# Patient Record
Sex: Female | Born: 1990
Health system: Southern US, Community
[De-identification: ages and names within clinical notes are randomized; demographics above are authoritative.]

## PROBLEM LIST (undated history)

## (undated) DIAGNOSIS — R87629 Unspecified abnormal cytological findings in specimens from vagina: Secondary | ICD-10-CM

## (undated) DIAGNOSIS — Z9889 Other specified postprocedural states: Secondary | ICD-10-CM

## (undated) DIAGNOSIS — R112 Nausea with vomiting, unspecified: Secondary | ICD-10-CM

## (undated) DIAGNOSIS — N946 Dysmenorrhea, unspecified: Secondary | ICD-10-CM

## (undated) HISTORY — PX: WISDOM TOOTH EXTRACTION: SHX21

## (undated) HISTORY — DX: Dysmenorrhea, unspecified: N94.6

## (undated) HISTORY — DX: Unspecified abnormal cytological findings in specimens from vagina: R87.629

---

## 2011-11-14 ENCOUNTER — Encounter: Payer: Self-pay | Admitting: Internal Medicine

## 2011-11-14 ENCOUNTER — Ambulatory Visit (INDEPENDENT_AMBULATORY_CARE_PROVIDER_SITE_OTHER): Payer: BC Managed Care – PPO | Admitting: Internal Medicine

## 2011-11-14 VITALS — BP 110/70 | HR 68 | Temp 97.0°F | Ht 68.0 in | Wt 160.0 lb

## 2011-11-14 DIAGNOSIS — R350 Frequency of micturition: Secondary | ICD-10-CM

## 2011-11-14 DIAGNOSIS — R319 Hematuria, unspecified: Secondary | ICD-10-CM | POA: Insufficient documentation

## 2011-11-14 DIAGNOSIS — Z8271 Family history of polycystic kidney: Secondary | ICD-10-CM

## 2011-11-14 DIAGNOSIS — N946 Dysmenorrhea, unspecified: Secondary | ICD-10-CM | POA: Insufficient documentation

## 2011-11-14 LAB — CBC WITH DIFFERENTIAL/PLATELET
Basophils Absolute: 0.1 10*3/uL (ref 0.0–0.1)
Basophils Relative: 1 % (ref 0–1)
Eosinophils Absolute: 0.2 10*3/uL (ref 0.0–0.7)
HCT: 42.4 % (ref 36.0–46.0)
MCHC: 34.2 g/dL (ref 30.0–36.0)
Monocytes Absolute: 0.5 10*3/uL (ref 0.1–1.0)
Neutro Abs: 2.9 10*3/uL (ref 1.7–7.7)
RDW: 12.2 % (ref 11.5–15.5)

## 2011-11-14 LAB — COMPREHENSIVE METABOLIC PANEL
AST: 19 U/L (ref 0–37)
Albumin: 4.1 g/dL (ref 3.5–5.2)
Alkaline Phosphatase: 37 U/L — ABNORMAL LOW (ref 39–117)
BUN: 11 mg/dL (ref 6–23)
Creat: 0.8 mg/dL (ref 0.50–1.10)
Potassium: 4.1 mEq/L (ref 3.5–5.3)
Total Bilirubin: 0.5 mg/dL (ref 0.3–1.2)

## 2011-11-14 LAB — LIPID PANEL
HDL: 76 mg/dL (ref >39)
LDL Cholesterol: 95 mg/dL (ref 0–99)
Total CHOL/HDL Ratio: 2.7 Ratio
VLDL: 33 mg/dL (ref 0–40)

## 2011-11-14 LAB — POCT URINALYSIS DIPSTICK
Nitrite, UA: NEGATIVE
Urobilinogen, UA: NEGATIVE
pH, UA: 6

## 2011-11-14 LAB — TSH: TSH: 1.154 u[IU]/mL (ref 0.350–4.500)

## 2011-11-14 NOTE — Patient Instructions (Signed)
Labs will be mailed to you  Schedule complete exam with me

## 2011-11-14 NOTE — Progress Notes (Signed)
  Subjective:    Patient ID: Julia Stephens, female    DOB: 01-23-1991, 20 y.o.   MRN: [id number]  HPI  New pt here for first visit.  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  Former care from [employer] in [place].  PMH of dysmenorrhea controlled with Oc's, and GH of polycystic kidney disease  She is concerned about urinary frequency.  She had 2 UTI's over summer.  No dysruria  No Known Allergies Past Medical History  Diagnosis Date  . Dysmenorrhea    History reviewed. No pertinent past surgical history. History   Social History  . Marital Status: Single    Spouse Name: N/A    Number of Children: N/A  . Years of Education: N/A   Occupational History  . Not on file.   Social History Main Topics  . Smoking status: Never Smoker   . Smokeless tobacco: Never Used  . Alcohol Use: No  . Drug Use: No  . Sexually Active: Yes    Birth Control/ Protection: Condom, Pill   Other Topics Concern  . Not on file   Social History Narrative  . No narrative on file   Family History  Problem Relation Age of Onset  . Polycystic kidney disease Maternal Grandmother   . Diabetes Maternal Grandmother   . Hypertension Maternal Grandmother   . Diabetes Maternal Grandfather   . Hypertension Maternal Grandfather    Patient Active Problem List  Diagnoses  . Dysmenorrhea  . Family history of polycystic kidney disease  . Hematuria   No current outpatient prescriptions on file prior to visit.        Review of Systems See HPI    Objective:   Physical Exam Physical Exam  Nursing note and vitals reviewed.  Constitutional: She is oriented to person, place, and time. She appears well-developed and well-nourished.  HENT:  Head: Normocephalic and atraumatic.  Cardiovascular: Normal rate and regular rhythm. Exam reveals no gallop and no friction rub.  No murmur heard.  Pulmonary/Chest: Breath sounds normal. She has no wheezes. She has no rales.  Neurological: She is alert and oriented to  person, place, and time.  Skin: Skin is warm and dry.  Psychiatric: She has a normal mood and affect. Her behavior is normal.        Assessment & Plan:  Dysmenorrhea:  OK to continue Apri 2)  Urinary frequencY:  U/A  Pos trace blood and protein .  Pt not on menses.  Will repeat at CPE visit and if still positive  Will get u/S .  Strong FH of polycystic kidney disease

## 2011-11-15 ENCOUNTER — Encounter: Payer: Self-pay | Admitting: Emergency Medicine

## 2011-12-04 ENCOUNTER — Encounter: Payer: BC Managed Care – PPO | Admitting: Internal Medicine

## 2011-12-07 ENCOUNTER — Ambulatory Visit (INDEPENDENT_AMBULATORY_CARE_PROVIDER_SITE_OTHER): Payer: BC Managed Care – PPO | Admitting: Internal Medicine

## 2011-12-07 ENCOUNTER — Ambulatory Visit (HOSPITAL_BASED_OUTPATIENT_CLINIC_OR_DEPARTMENT_OTHER)
Admission: RE | Admit: 2011-12-07 | Discharge: 2011-12-07 | Disposition: A | Discharge status: Home / Self Care | Payer: BC Managed Care – PPO | Source: Ambulatory Visit | Attending: Internal Medicine | Admitting: Internal Medicine

## 2011-12-07 ENCOUNTER — Encounter: Payer: Self-pay | Admitting: Internal Medicine

## 2011-12-07 VITALS — BP 110/70 | HR 77 | Temp 97.3°F | Resp 12 | Ht 68.0 in | Wt 164.0 lb

## 2011-12-07 DIAGNOSIS — Z8271 Family history of polycystic kidney: Secondary | ICD-10-CM

## 2011-12-07 DIAGNOSIS — R319 Hematuria, unspecified: Secondary | ICD-10-CM

## 2011-12-07 DIAGNOSIS — Z01419 Encounter for gynecological examination (general) (routine) without abnormal findings: Secondary | ICD-10-CM

## 2011-12-07 DIAGNOSIS — N946 Dysmenorrhea, unspecified: Secondary | ICD-10-CM

## 2011-12-07 DIAGNOSIS — Z113 Encounter for screening for infections with a predominantly sexual mode of transmission: Secondary | ICD-10-CM

## 2011-12-07 DIAGNOSIS — Z1272 Encounter for screening for malignant neoplasm of vagina: Secondary | ICD-10-CM

## 2011-12-07 LAB — POCT URINALYSIS DIPSTICK
Bilirubin, UA: NEGATIVE
Glucose, UA: NEGATIVE
Ketones, UA: NEGATIVE
Leukocytes, UA: NEGATIVE
pH, UA: 6

## 2011-12-07 MED ORDER — DESOGESTREL-ETHINYL ESTRADIOL 0.15-30 MG-MCG PO TABS: 1.0000 | ORAL_TABLET | Freq: Every day | ORAL | Status: DC

## 2011-12-07 NOTE — Progress Notes (Signed)
Subjective:    Patient ID: Julia Stephens, female    DOB: Jan 07, 1991, 21 y.o.   MRN: [id number]  HPI [name] is here for comprehensive eval.  Doing well on OC's  Less cramping.  She is concerned over PCKD.  Note last U/A revealed  Hematuria with no growth on C and S.  She denies change in color of urine or dysuria, urgency of frequency.    Report she had a normal pap smear 1-2 years ago in [place].  She is sexually active with one partner.    She is studying PT at [employer] list last quarter.  She believes she is UTD with Tetanus  No Known Allergies Past Medical History  Diagnosis Date  . Dysmenorrhea    No past surgical history on file. History   Social History  . Marital Status: Single    Spouse Name: N/A    Number of Children: N/A  . Years of Education: N/A   Occupational History  . Not on file.   Social History Main Topics  . Smoking status: Never Smoker   . Smokeless tobacco: Never Used  . Alcohol Use: No  . Drug Use: No  . Sexually Active: Yes    Birth Control/ Protection: Condom, Pill   Other Topics Concern  . Not on file   Social History Narrative  . No narrative on file   Family History  Problem Relation Age of Onset  . Polycystic kidney disease Maternal Grandmother   . Diabetes Maternal Grandmother   . Hypertension Maternal Grandmother   . Diabetes Maternal Grandfather   . Hypertension Maternal Grandfather    Patient Active Problem List  Diagnoses  . Dysmenorrhea  . Family history of polycystic kidney disease  . Hematuria   Current Outpatient Prescriptions on File Prior to Visit  Medication Sig Dispense Refill  . desogestrel-ethinyl estradiol (APRI,EMOQUETTE,SOLIA) 0.15-30 MG-MCG tablet Take 1 tablet by mouth daily.             Review of Systems    no chest pain , no SOB No dyspepsia, no blood in stool Objective:   Physical Exam  Physical Exam  Vital signs and nursing note reviewed  Constitutional: She is oriented to person, place,  and time. She appears well-developed and well-nourished. She is cooperative.  HENT:  Head: Normocephalic and atraumatic.  Right Ear: Tympanic membrane normal.  Left Ear: Tympanic membrane normal.  Nose: Nose normal.  Mouth/Throat: Oropharynx is clear and moist and mucous membranes are normal. No oropharyngeal exudate or posterior oropharyngeal erythema.  Eyes: Conjunctivae and EOM are normal. Pupils are equal, round, and reactive to light.  Neck: Neck supple. No JVD present. Carotid bruit is not present. No mass and no thyromegaly present.  Cardiovascular: Regular rhythm, normal heart sounds, intact distal pulses and normal pulses.  Exam reveals no gallop and no friction rub.   No murmur heard. Pulses:      Dorsalis pedis pulses are 2+ on the right side, and 2+ on the left side.  Pulmonary/Chest: Breath sounds normal. She has no wheezes. She has no rhonchi. She has no rales. Right breast exhibits no mass, no nipple discharge and no skin change. Left breast exhibits no mass, no nipple discharge and no skin change.  Abdominal: Soft. Bowel sounds are normal. She exhibits no distension and no mass. There is no hepatosplenomegaly. There is no tenderness. There is no CVA tenderness.  Genitourinary: Rectum normal, vagina normal and uterus normal. Rectal exam shows no mass.Marland Kitchen  No labial fusion. There is no lesion on the right labia. There is no lesion on the left labia. Cervix exhibits no motion tenderness. Right adnexum displays no mass, no tenderness and no fullness. Left adnexum displays no mass, no tenderness and no fullness. No erythema around the vagina.  Musculoskeletal:       No active synovitis to any joint.    Lymphadenopathy:       Right cervical: No superficial cervical adenopathy present.      Left cervical: No superficial cervical adenopathy present.       Right axillary: No pectoral and no lateral adenopathy present.       Left axillary: No pectoral and no lateral adenopathy present.       Right: No inguinal adenopathy present.       Left: No inguinal adenopathy present.  Neurological: She is alert and oriented to person, place, and time. She has normal strength and normal reflexes. No cranial nerve deficit or sensory deficit. She displays a negative Romberg sign. Coordination and gait normal.  Skin: Skin is warm and dry. No abrasion, no bruising, no ecchymosis and no rash noted. No cyanosis. Nails show no clubbing.  Psychiatric: She has a normal mood and affect. Her speech is normal and behavior is normal.          Assessment & Plan:  1)  HM  Taught SBE.  Counseled safe sex practices.  She declines influenza vaccine.  She reports received 3 series of HPV vaccine.  Counseled on adequate doses of CA and vit D 2)  Hematuria  Still trace pos today.  With FH of PCKD will get renal ultrasound today 3)  Dysmenorrhea  Will continue Apri      Assessment & Plan:

## 2011-12-07 NOTE — Patient Instructions (Signed)
Use condoms for safer sex  Exercise as much as possible  Follow heart healty diet

## 2011-12-14 ENCOUNTER — Telehealth: Payer: Self-pay | Admitting: Internal Medicine

## 2011-12-14 NOTE — Telephone Encounter (Signed)
Left message on voicemail  Pt unavailabe and told to call office on Monday

## 2011-12-14 NOTE — Telephone Encounter (Signed)
Left message on pts voice mail to call back regarding U/S and pap result.

## 2011-12-18 ENCOUNTER — Telehealth: Payer: Self-pay | Admitting: Internal Medicine

## 2011-12-18 ENCOUNTER — Encounter: Payer: Self-pay | Admitting: Internal Medicine

## 2011-12-18 DIAGNOSIS — IMO0002 Reserved for concepts with insufficient information to code with codable children: Secondary | ICD-10-CM | POA: Insufficient documentation

## 2011-12-18 DIAGNOSIS — R87612 Low grade squamous intraepithelial lesion on cytologic smear of cervix (LGSIL): Secondary | ICD-10-CM

## 2011-12-18 NOTE — Telephone Encounter (Signed)
Spoke with pt and informed of LGSIL pap and HPV positive on result.  Stressed importance of having GYN eval.  Will set up referral.  She voices understanding

## 2011-12-19 ENCOUNTER — Encounter: Payer: Self-pay | Admitting: Internal Medicine

## 2011-12-27 ENCOUNTER — Telehealth: Payer: Self-pay | Admitting: Emergency Medicine

## 2011-12-27 NOTE — Telephone Encounter (Signed)
Julia Stephens's lab letter dated 11/15/11 was returned marked "Return to Sender, unable to forward, undeliverable as addressed" by the [employer].  Letter was mailed to home address listed in Epic.

## 2012-01-09 ENCOUNTER — Other Ambulatory Visit: Payer: Self-pay | Admitting: Obstetrics and Gynecology

## 2012-01-09 HISTORY — PX: COLPOSCOPY: SHX161

## 2012-09-18 ENCOUNTER — Emergency Department (HOSPITAL_BASED_OUTPATIENT_CLINIC_OR_DEPARTMENT_OTHER)
Admission: EM | Admit: 2012-09-18 | Discharge: 2012-09-18 | Disposition: A | Discharge status: Home / Self Care | Payer: Worker's Compensation | Attending: Emergency Medicine | Admitting: Emergency Medicine

## 2012-09-18 ENCOUNTER — Emergency Department (HOSPITAL_BASED_OUTPATIENT_CLINIC_OR_DEPARTMENT_OTHER): Payer: Worker's Compensation

## 2012-09-18 ENCOUNTER — Encounter (HOSPITAL_BASED_OUTPATIENT_CLINIC_OR_DEPARTMENT_OTHER): Payer: Self-pay | Admitting: *Deleted

## 2012-09-18 DIAGNOSIS — Y990 Civilian activity done for income or pay: Secondary | ICD-10-CM | POA: Insufficient documentation

## 2012-09-18 DIAGNOSIS — S8010XA Contusion of unspecified lower leg, initial encounter: Secondary | ICD-10-CM | POA: Insufficient documentation

## 2012-09-18 DIAGNOSIS — IMO0002 Reserved for concepts with insufficient information to code with codable children: Secondary | ICD-10-CM | POA: Insufficient documentation

## 2012-09-18 DIAGNOSIS — Y9289 Other specified places as the place of occurrence of the external cause: Secondary | ICD-10-CM | POA: Insufficient documentation

## 2012-09-18 NOTE — ED Provider Notes (Signed)
History     CSN: [id number]  Arrival date & time 09/18/12  4098   First MD Initiated Contact with Patient 09/18/12 2030      Chief Complaint  Patient presents with  . Leg Pain    (Consider location/radiation/quality/duration/timing/severity/associated sxs/prior treatment) Patient is a 21 y.o. female presenting with leg pain. The history is provided by the patient.  Leg Pain  The incident occurred 1 to 2 hours ago. The incident occurred at work. The injury mechanism was a direct blow. Pain location: left lower shin. The quality of the pain is described as throbbing. The pain is moderate. The pain has been constant since onset. Pertinent negatives include no numbness and no inability to bear weight. The symptoms are aggravated by activity, bearing weight and palpation.    Past Medical History  Diagnosis Date  . Dysmenorrhea     History reviewed. No pertinent past surgical history.  Family History  Problem Relation Age of Onset  . Polycystic kidney disease Maternal Grandmother   . Diabetes Maternal Grandmother   . Hypertension Maternal Grandmother   . Diabetes Maternal Grandfather   . Hypertension Maternal Grandfather     History  Substance Use Topics  . Smoking status: Never Smoker   . Smokeless tobacco: Never Used  . Alcohol Use: No    OB History    Grav Para Term Preterm Abortions TAB SAB Ect Mult Living   0 0              Review of Systems  Neurological: Negative for numbness.  All other systems reviewed and are negative.    Allergies  Review of patient's allergies indicates no known allergies.  Home Medications   Current Outpatient Rx  Name Route Sig Dispense Refill  . DESOGESTREL-ETHINYL ESTRADIOL 0.15-30 MG-MCG PO TABS Oral Take 1 tablet by mouth daily. 1 Package 11    BP 137/79  Pulse 77  Temp 98.2 F (36.8 C) (Oral)  Resp 16  Ht 5\' 8"  (1.727 m)  Wt 155 lb (70.308 kg)  BMI 23.57 kg/m2  SpO2 100%  LMP 08/22/2012  Physical Exam  Nursing  note and vitals reviewed. Constitutional: She is oriented to person, place, and time. She appears well-developed and well-nourished. No distress.  HENT:  Head: Normocephalic and atraumatic.  Neck: Normal range of motion. Neck supple.  Musculoskeletal:       The left lower extremity is noted to have an abrasion with surrounding swelling and ttp.  The distal extremity is intact both neurologically and vascularly.  Neurological: She is alert and oriented to person, place, and time.  Skin: Skin is warm and dry. She is not diaphoretic.    ED Course  Procedures (including critical care time)  Labs Reviewed - No data to display Dg Tibia/fibula Left  09/18/2012  *RADIOLOGY REPORT*  Clinical Data: Trauma.  Pain.  LEFT TIBIA AND FIBULA - 2 VIEW  Comparison: None.  Findings: A nutrient foramen within the fibular shaft on the first AP view. No acute fracture or dislocation.  No knee joint effusion.  IMPRESSION: No acute osseous abnormality.   Original Report Authenticated By: Consuello Bossier, M.D.      No diagnosis found.    MDM  Will treat as a contusion.  Ice, rest, time.  Return prn if she worsens.        Geoffery Lyons, MD 09/18/12 2046

## 2012-09-18 NOTE — ED Notes (Signed)
Pt c/o injury to left lower  leg x 2 hrs ago

## 2012-11-04 ENCOUNTER — Other Ambulatory Visit: Payer: Self-pay | Admitting: *Deleted

## 2012-11-04 DIAGNOSIS — N946 Dysmenorrhea, unspecified: Secondary | ICD-10-CM

## 2012-11-04 MED ORDER — DESOGESTREL-ETHINYL ESTRADIOL 0.15-30 MG-MCG PO TABS: 1.0000 | ORAL_TABLET | Freq: Every day | ORAL | Status: DC

## 2012-11-04 NOTE — Telephone Encounter (Signed)
Pt has scheduled her CPE with pap in Feb needs refill until then

## 2012-11-10 ENCOUNTER — Other Ambulatory Visit: Payer: Self-pay | Admitting: Internal Medicine

## 2012-11-11 NOTE — Telephone Encounter (Signed)
Julia Stephens  I refilled 3 month of OC"S for this pt.  I need to see my patients once a year to refill Oc's.  Call pt and let her know that she needs to see me sometime in the next 3 months before she runs out again

## 2012-11-11 NOTE — Telephone Encounter (Signed)
Refill request

## 2012-11-11 NOTE — Telephone Encounter (Signed)
Called pt regarding appt 

## 2012-11-12 ENCOUNTER — Other Ambulatory Visit: Payer: Self-pay | Admitting: Obstetrics and Gynecology

## 2013-01-08 ENCOUNTER — Other Ambulatory Visit: Payer: Self-pay | Admitting: Internal Medicine

## 2013-01-08 ENCOUNTER — Encounter: Payer: Self-pay | Admitting: Internal Medicine

## 2013-01-08 ENCOUNTER — Ambulatory Visit (INDEPENDENT_AMBULATORY_CARE_PROVIDER_SITE_OTHER): Payer: BC Managed Care – PPO | Admitting: Internal Medicine

## 2013-01-08 VITALS — BP 120/70 | HR 87 | Temp 97.1°F | Resp 16 | Ht 68.0 in | Wt 163.0 lb

## 2013-01-08 DIAGNOSIS — R6889 Other general symptoms and signs: Secondary | ICD-10-CM

## 2013-01-08 DIAGNOSIS — Z0000 Encounter for general adult medical examination without abnormal findings: Secondary | ICD-10-CM

## 2013-01-08 DIAGNOSIS — Z8271 Family history of polycystic kidney: Secondary | ICD-10-CM

## 2013-01-08 DIAGNOSIS — Z8619 Personal history of other infectious and parasitic diseases: Secondary | ICD-10-CM

## 2013-01-08 DIAGNOSIS — IMO0002 Reserved for concepts with insufficient information to code with codable children: Secondary | ICD-10-CM

## 2013-01-08 LAB — CBC WITH DIFFERENTIAL/PLATELET
Basophils Relative: 1 % (ref 0–1)
Eosinophils Absolute: 0.2 10*3/uL (ref 0.0–0.7)
HCT: 42.6 % (ref 36.0–46.0)
Hemoglobin: 15.2 g/dL — ABNORMAL HIGH (ref 12.0–15.0)
Lymphs Abs: 2.5 10*3/uL (ref 0.7–4.0)
MCH: 31.3 pg (ref 26.0–34.0)
MCHC: 35.7 g/dL (ref 30.0–36.0)
MCV: 87.7 fL (ref 78.0–100.0)
Monocytes Absolute: 0.7 10*3/uL (ref 0.1–1.0)
Monocytes Relative: 10 % (ref 3–12)
Neutrophils Relative %: 50 % (ref 43–77)
RBC: 4.86 MIL/uL (ref 3.87–5.11)

## 2013-01-08 LAB — POCT URINALYSIS DIPSTICK
Ketones, UA: NEGATIVE
Spec Grav, UA: 1.015
Urobilinogen, UA: 0.2
pH, UA: 6.5

## 2013-01-08 NOTE — Patient Instructions (Addendum)
Follow up with Dr. Eustaquio Boyden  For pap   See me as needed

## 2013-01-08 NOTE — Progress Notes (Signed)
Subjective:    Patient ID: Julia Stephens, female    DOB: Jul 15, 1991, 22 y.o.   MRN: [id number]  HPI  Shilee is here for CPE.  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  Studies going well.    Tolerating OC's well  In monogamous relationship with live in boyfriend  See scanned note Dr. Priscille Kluver office  She had cryotherapy for LGSIL and HPV pos.  Pt reports she had pap last December at Dr. Priscille Kluver office and she reports it came back normal  Pt also reports she had chickenpox as a child and also has had shingles in the past.  First shingles episode was diagnosed by MD in [facility]  Allergies  Allergen Reactions  . Other Itching    Allergic to cats   Past Medical History  Diagnosis Date  . Dysmenorrhea    Past Surgical History  Procedure Date  . Colposcopy 01/09/2012    Colpo LGSILw/HPV   History   Social History  . Marital Status: Single    Spouse Name: N/A    Number of Children: N/A  . Years of Education: N/A   Occupational History  . Not on file.   Social History Main Topics  . Smoking status: Never Smoker   . Smokeless tobacco: Never Used  . Alcohol Use: Yes  . Drug Use: No  . Sexually Active: Yes    Birth Control/ Protection: Condom, Pill   Other Topics Concern  . Not on file   Social History Narrative  . No narrative on file   Family History  Problem Relation Age of Onset  . Polycystic kidney disease Maternal Grandmother   . Diabetes Maternal Grandmother   . Hypertension Maternal Grandmother   . Vision loss Maternal Grandmother   . Diabetes Maternal Grandfather   . Hypertension Maternal Grandfather   . Vision loss Maternal Grandfather   . Hyperlipidemia Father   . Hypertension Father   . Hyperlipidemia Brother   . Hypertension Brother   . Diabetes Paternal Grandfather    Patient Active Problem List  Diagnosis  . Dysmenorrhea  . Family history of polycystic kidney disease  . Hematuria  . Low grade squamous intraepithelial dysplasia   Current Outpatient  Prescriptions on File Prior to Visit  Medication Sig Dispense Refill  . desogestrel-ethinyl estradiol (APRI,EMOQUETTE,SOLIA) 0.15-30 MG-MCG tablet Take 1 tablet by mouth daily.  1 Package  11      Review of Systems  All other systems reviewed and are negative.       Objective:   Physical Exam Physical Exam  Nursing note and vitals reviewed.  Constitutional: She is oriented to person, place, and time. She appears well-developed and well-nourished.  HENT:  Head: Normocephalic and atraumatic.  Right Ear: Tympanic membrane and ear canal normal. No drainage. Tympanic membrane is not injected and not erythematous.  Left Ear: Tympanic membrane and ear canal normal. No drainage. Tympanic membrane is not injected and not erythematous.  Nose: Nose normal. Right sinus exhibits no maxillary sinus tenderness and no frontal sinus tenderness. Left sinus exhibits no maxillary sinus tenderness and no frontal sinus tenderness.  Mouth/Throat: Oropharynx is clear and moist. No oral lesions. No oropharyngeal exudate.  Eyes: Conjunctivae and EOM are normal. Pupils are equal, round, and reactive to light.  Neck: Normal range of motion. Neck supple. No JVD present. Carotid bruit is not present. No mass and no thyromegaly present.  Cardiovascular: Normal rate, regular rhythm, S1 normal, S2 normal and intact distal pulses. Exam reveals no gallop  and no friction rub.  No murmur heard.  Pulses:  Carotid pulses are 2+ on the right side, and 2+ on the left side.  Dorsalis pedis pulses are 2+ on the right side, and 2+ on the left side.  No carotid bruit. No LE edema  Pulmonary/Chest: Breath sounds normal. She has no wheezes. She has no rales. She exhibits no tenderness.  Abdominal: Soft. Bowel sounds are normal. She exhibits no distension and no mass. There is no hepatosplenomegaly. There is no tenderness. There is no CVA tenderness.  Musculoskeletal: Normal range of motion.  No active synovitis to joints.   Lymphadenopathy:  She has no cervical adenopathy.  She has no axillary adenopathy.  Right: No inguinal and no supraclavicular adenopathy present.  Left: No inguinal and no supraclavicular adenopathy present.  Neurological: She is alert and oriented to person, place, and time. She has normal strength and normal reflexes. She displays no tremor. No cranial nerve deficit or sensory deficit. Coordination and gait normal.  Skin: Skin is warm and dry. No rash noted. No cyanosis. Nails show no clubbing.  Psychiatric: She has a normal mood and affect. Her speech is normal and behavior is normal. Cognition and memory are normal.           Assessment & Plan:  Health Maintenance  UTD with vaccines.  Gave infor for shingles.  History of LCSIL S/P cryotherapy.  Pt advised to follow up with Dr. Eustaquio Boyden  History of shingles  See above   FH of PCKD

## 2013-01-09 ENCOUNTER — Telehealth: Payer: Self-pay | Admitting: *Deleted

## 2013-01-09 LAB — LIPID PANEL
Cholesterol: 194 mg/dL (ref 0–200)
LDL Cholesterol: 85 mg/dL (ref 0–99)
Total CHOL/HDL Ratio: 2.7 Ratio
VLDL: 36 mg/dL (ref 0–40)

## 2013-01-09 LAB — T3, FREE: T3, Free: 3.2 pg/mL (ref 2.3–4.2)

## 2013-01-09 LAB — TSH: TSH: 0.345 u[IU]/mL — ABNORMAL LOW (ref 0.350–4.500)

## 2013-01-09 NOTE — Telephone Encounter (Signed)
Free T3 and Free T4 added to original labs

## 2013-01-13 ENCOUNTER — Telehealth: Payer: Self-pay | Admitting: Internal Medicine

## 2013-01-13 NOTE — Telephone Encounter (Signed)
Called pt regarding labs  Pt not available.  Counseled to call office back

## 2013-01-14 ENCOUNTER — Telehealth: Payer: Self-pay | Admitting: Internal Medicine

## 2013-01-14 ENCOUNTER — Encounter: Payer: Self-pay | Admitting: *Deleted

## 2013-01-14 NOTE — Telephone Encounter (Signed)
Spoke with pt and informed of TSH results.  She has normal free levels  ADvised pt to come for repeat TSH.  She voices understanding

## 2013-01-20 ENCOUNTER — Telehealth: Payer: Self-pay | Admitting: *Deleted

## 2013-01-20 DIAGNOSIS — R7989 Other specified abnormal findings of blood chemistry: Secondary | ICD-10-CM

## 2013-01-22 NOTE — Telephone Encounter (Signed)
orders

## 2013-01-24 LAB — TSH: TSH: 0.192 u[IU]/mL — ABNORMAL LOW (ref 0.350–4.500)

## 2013-01-28 ENCOUNTER — Telehealth: Payer: Self-pay | Admitting: Internal Medicine

## 2013-01-28 DIAGNOSIS — E0590 Thyrotoxicosis, unspecified without thyrotoxic crisis or storm: Secondary | ICD-10-CM

## 2013-01-28 NOTE — Telephone Encounter (Signed)
Spoke with pt.  Repeat TSH still supressed.  Will get nuclear scan and refer to Dr. Elvera Lennox

## 2013-01-29 ENCOUNTER — Other Ambulatory Visit: Payer: Self-pay | Admitting: Internal Medicine

## 2013-01-29 ENCOUNTER — Telehealth: Payer: Self-pay | Admitting: *Deleted

## 2013-01-29 NOTE — Telephone Encounter (Signed)
Called [facility] radiology will need to verify orders

## 2013-01-30 ENCOUNTER — Other Ambulatory Visit: Payer: Self-pay | Admitting: Internal Medicine

## 2013-01-30 ENCOUNTER — Telehealth: Payer: Self-pay | Admitting: *Deleted

## 2013-01-30 DIAGNOSIS — E0590 Thyrotoxicosis, unspecified without thyrotoxic crisis or storm: Secondary | ICD-10-CM

## 2013-01-30 NOTE — Telephone Encounter (Signed)
Spoke with [facility]iology regarding Thyroid scan appt scheduled for 3/10-3/11at 08:00. Left message for pt regarding appt info

## 2013-02-05 ENCOUNTER — Telehealth: Payer: Self-pay | Admitting: Internal Medicine

## 2013-02-05 ENCOUNTER — Encounter: Payer: Self-pay | Admitting: *Deleted

## 2013-02-05 NOTE — Telephone Encounter (Signed)
Left message for pt to call office regarding upcoming test.  Apparently a problem with payment for test and pt does not want to take it

## 2013-02-10 ENCOUNTER — Encounter (HOSPITAL_COMMUNITY)
Admission: RE | Admit: 2013-02-10 | Discharge: 2013-02-10 | Disposition: A | Discharge status: Home / Self Care | Payer: BC Managed Care – PPO | Source: Ambulatory Visit | Attending: Internal Medicine | Admitting: Internal Medicine

## 2013-02-10 ENCOUNTER — Ambulatory Visit (HOSPITAL_COMMUNITY): Payer: Self-pay

## 2013-02-10 DIAGNOSIS — E0590 Thyrotoxicosis, unspecified without thyrotoxic crisis or storm: Secondary | ICD-10-CM | POA: Insufficient documentation

## 2013-02-11 ENCOUNTER — Telehealth: Payer: Self-pay | Admitting: *Deleted

## 2013-02-11 ENCOUNTER — Other Ambulatory Visit (HOSPITAL_COMMUNITY): Payer: Self-pay

## 2013-02-11 ENCOUNTER — Telehealth: Payer: Self-pay | Admitting: Internal Medicine

## 2013-02-11 ENCOUNTER — Encounter (HOSPITAL_COMMUNITY)
Admission: RE | Admit: 2013-02-11 | Discharge: 2013-02-11 | Disposition: A | Discharge status: Home / Self Care | Payer: BC Managed Care – PPO | Source: Ambulatory Visit | Attending: Internal Medicine | Admitting: Internal Medicine

## 2013-02-11 DIAGNOSIS — E061 Subacute thyroiditis: Secondary | ICD-10-CM

## 2013-02-11 MED ORDER — SODIUM IODIDE I 131 CAPSULE
11.0000 | Freq: Once | INTRAVENOUS | Status: AC | PRN
  Administered 2013-02-11: 11 via ORAL

## 2013-02-11 MED ORDER — SODIUM PERTECHNETATE TC 99M INJECTION
9.3000 | Freq: Once | INTRAVENOUS | Status: AC | PRN
  Administered 2013-02-11: 9 via INTRAVENOUS

## 2013-02-11 NOTE — Telephone Encounter (Signed)
Spoke with pt and informed of nuclear scan  Will refer to endocrinology

## 2013-02-12 ENCOUNTER — Telehealth: Payer: Self-pay | Admitting: *Deleted

## 2013-02-12 NOTE — Telephone Encounter (Signed)
Pt notified of appt with Dr Elvera Lennox on 3/20 at 4:00

## 2013-02-20 ENCOUNTER — Encounter: Payer: Self-pay | Admitting: Internal Medicine

## 2013-02-20 ENCOUNTER — Ambulatory Visit (INDEPENDENT_AMBULATORY_CARE_PROVIDER_SITE_OTHER): Payer: BC Managed Care – PPO | Admitting: Internal Medicine

## 2013-02-20 VITALS — BP 112/64 | HR 69 | Temp 98.3°F | Resp 10 | Ht 67.5 in | Wt 163.0 lb

## 2013-02-20 DIAGNOSIS — R946 Abnormal results of thyroid function studies: Secondary | ICD-10-CM

## 2013-02-20 DIAGNOSIS — R7989 Other specified abnormal findings of blood chemistry: Secondary | ICD-10-CM | POA: Insufficient documentation

## 2013-02-20 NOTE — Patient Instructions (Addendum)
Please return to have a new set of thyroid labs in 2 months. If they are normal, then he can continue to see Dr. Constance Goltz, and return to see me if they become abnormal again. Please review the handout about thyroiditis.

## 2013-02-20 NOTE — Progress Notes (Signed)
Subjective:     Patient ID: Julia Stephens, female   DOB: 10-14-1991, 21 y.o.   MRN: [id number]  HPI Julia Stephens is a pleasant 39 year old woman, referred by her PCP, Dr. Constance Goltz, for evaluation and treatment for low TSH, ? thyroiditis.  Patient has not been diagnosed with any thyroid disorders in the past, and had a normal TSH in 11/2011, at 1.154. She subsequently had an incidental low TSH on the labs ordered for an annual physical exam on 01/08/2013 (TSH 0.345). Repeat TSH on 01/24/2013 was 0.192, with normal free T4 at 1.40, and free T3 is at 3.2. A thyroid uptake and scan was ordered, and it returned low at 2.8% (normal 10-30%), suggesting subacute thyroiditis.   Patient remembers that she had a cold 2 months ago; no fever, but a little sore throat. Did not feel that her thyroid is enlarged. Could not appreciate that her lymph nodes are enlarged. She had shingles 3x (near the neck) - las episode 2 years ago - and cervical LNs became enlarged then. Denies tremors, palpitations, feeling hot, weight loss, problems with sleep or concentration, diarrhea. She also denies increased fatigue, feeling cold, depression or anxiety, constipation, weight gain, hair loss, dry skin. She exercises at least 3 times a week at the gym doing cardio and strength training.  No FH of thyroid disorder. No thyroid cancer hx. No FH of autoimmune ds.   Past Medical History  Diagnosis Date  . Dysmenorrhea     Past Surgical History  Procedure Laterality Date  . Colposcopy  01/09/2012    Colpo LGSILw/HPV   History   Social History  . Marital Status: Single    Spouse Name: N/A    Number of Children: 0   Occupational History  . student   Social History Main Topics  . Smoking status: Never Smoker   . Smokeless tobacco: Never Used  . Alcohol Use: Yes  . Drug Use: No  . Sexually Active: Yes    Birth Control/ Protection: Condom, Pill   Current Outpatient Prescriptions on File Prior to Visit  Medication Sig  Dispense Refill  . desogestrel-ethinyl estradiol (APRI,EMOQUETTE,SOLIA) 0.15-30 MG-MCG tablet Take 1 tablet by mouth daily.  1 Package  11   No current facility-administered medications on file prior to visit.   Allergies  Allergen Reactions  . Other Itching    Allergic to cats   Family History  Problem Relation Age of Onset  . Polycystic kidney disease Maternal Grandmother   . Diabetes Maternal Grandmother   . Hypertension Maternal Grandmother   . Vision loss Maternal Grandmother   . Diabetes Maternal Grandfather   . Hypertension Maternal Grandfather   . Vision loss Maternal Grandfather   . Hyperlipidemia Father   . Hypertension Father   . Hyperlipidemia Brother   . Hypertension Brother   . Diabetes Paternal Grandfather    Review of Systems Constitutional: no weight gain/loss, no fatigue, no subjective hyperthermia/hypothermia Eyes: no blurry vision, no xerophthalmia ENT: no sore throat, no nodules palpated in throat, no dysphagia/odynophagia, no hoarseness Cardiovascular: no CP/SOB/palpitations/leg swelling Respiratory: no cough/SOB Gastrointestinal: no N/V/D/C Musculoskeletal: no muscle/joint aches Skin: no rashes Neurological: no tremors/numbness/tingling/dizziness Psychiatric: no depression/anxiety  Objective:   Physical Exam BP 112/64  Pulse 69  Temp(Src) 98.3 F (36.8 C) (Oral)  Resp 10  Ht 5' 7.5" (1.715 m)  Wt 163 lb (73.936 kg)  BMI 25.14 kg/m2  SpO2 97%  LMP 02/03/2013 Wt Readings from Last 3 Encounters:  02/20/13 163 lb (73.936 kg)  01/08/13 163 lb (73.936 kg)  09/18/12 155 lb (70.308 kg)   Constitutional: overweight, in NAD Eyes: PERRLA, EOMI, no exophthalmos, no lid lag, no stare ENT: moist mucous membranes, no thyromegaly, no cervical lymphadenopathy Cardiovascular: RRR, No MRG Respiratory: CTA B Gastrointestinal: abdomen soft, NT, ND, BS+ Musculoskeletal: no deformities, strength intact in all 4 Skin: moist, warm, no rashes Neurological:  slight tremor with outstretched hands, DTR normal in all 4  Assessment:     1. Mild subacute thyroiditis - TSH 0.192, freeT4 1 0.40, freeT3 3.2 in 01/24/2013  - TSH 0.345 in 01/08/2013 - TSH 1.154 on 11/14/2011 - thyroid uptake and scan showed reduced uptake of 2.8% (10-30%), and no nodules - patient completely asymptomatic  Plan:     Patient with no previous history of thyroid disorder, incidentally found to have a low TSH on annual physical exam with PCP. An initial TSH was mildly low, and a subsequent one, obtained approximately a month ago was even lower, however free T4 and free T3 were normal (subclinical hyperthyroidism). Of note, the patient does not have any symptoms or signs of hyper- or hypo-thyroidism. - I discussed with the patient possible etiologies of low TSH: prescribed medications, herbal medicines, IV contrast, pregnancy, etc. however she did not have any of these, and denies the possibility of being pregnant. Thyroid pathology responsible of a low TSH would be: Graves' disease (however this would have increased thyroid iodine uptake), toxic uni- or multinodular goiter (however this would have an abnormal thyroid scan), or thyroiditis (associated with a low thyroid uptake). It is most likely that she had an episode of mild subacute thyroiditis (at the time of her URI in 2 months ago) which is probably resolving, judging by her lack of symptoms and signs of hyperthyroidism. Since her pulse is 69, she does not require beta-blockade. - I will recheck her TSH, free T4, and free T3, and will repeat the labs either here or in Dr. Lonell Face office in 2 months. I explained the evolution of thyroiditis, and the fact that 20% of patients that have it might become hypothyroid in the future. I also advised her that the thyroiditis episode might repeat in the future. - I will not schedule a return appointment for now, but will do so if the tests remain abnormal   Office Visit on 02/20/2013   Component Date Value Range Status  . TSH 02/20/2013 0.12* 0.35 - 5.50 uIU/mL Final  . Free T4 02/20/2013 0.98  0.60 - 1.60 ng/dL Final  . T3, Free 60/45/4098 3.1  2.3 - 4.2 pg/mL Final   Subclinical hyperthyroidism - since pt asymptomatic, will continue to watch, and repeat labs in 2 months as planned.

## 2013-02-21 LAB — T3, FREE: T3, Free: 3.1 pg/mL (ref 2.3–4.2)

## 2013-02-21 NOTE — Telephone Encounter (Signed)
error 

## 2013-04-26 IMAGING — CR DG TIBIA/FIBULA 2V*L*
4 series · 4 of 4 positions shown · non-contrast
Comparison: None.

CLINICAL DATA: Trauma.  Pain.

LEFT TIBIA AND FIBULA - 2 VIEW

[t tib/fib ap left (1 of 2)]
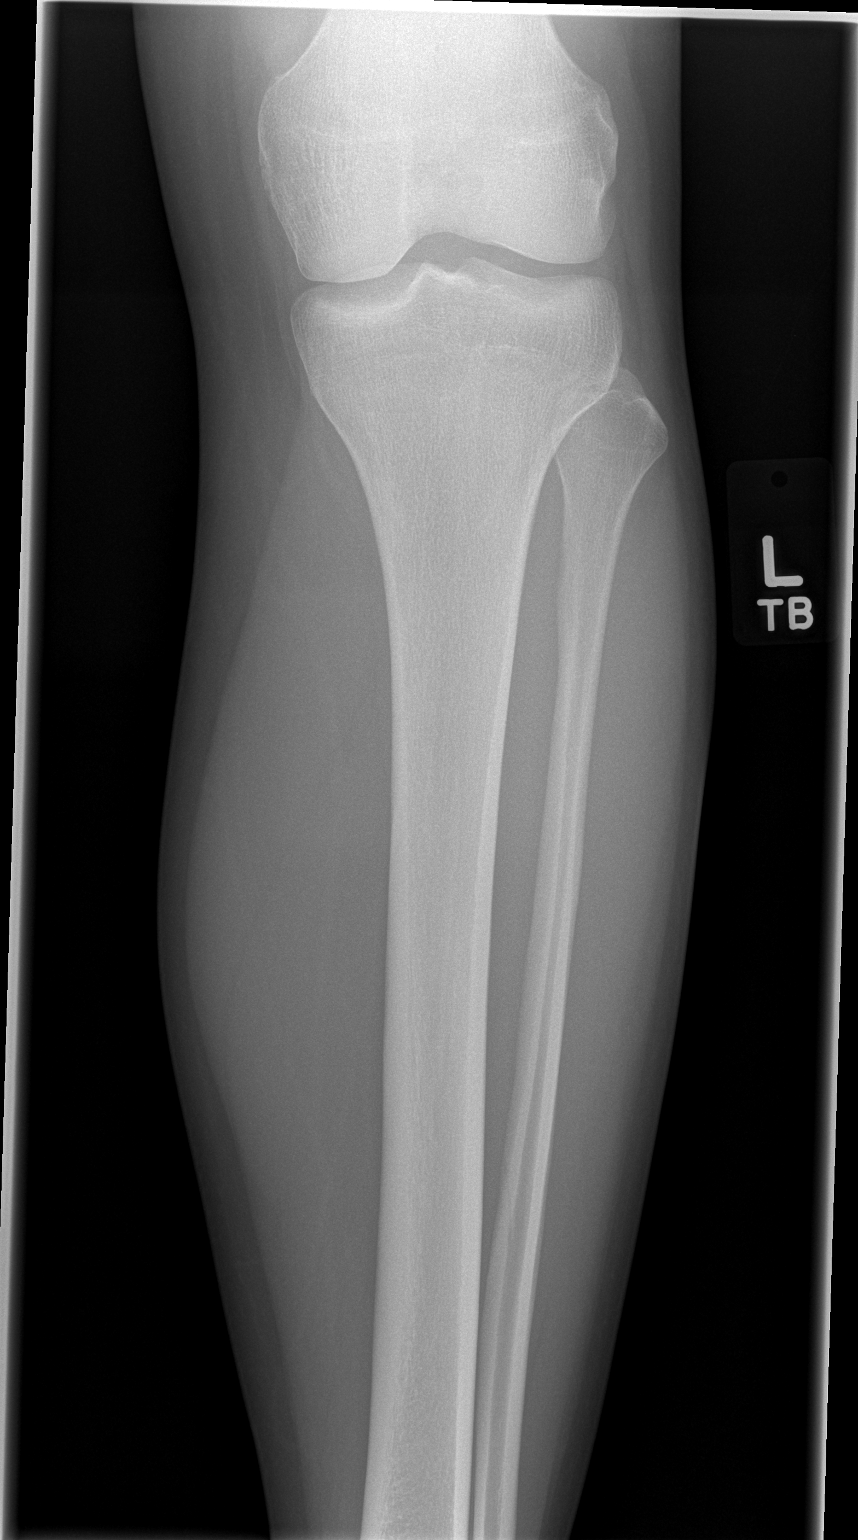

[t tib/fib ap left (2 of 2)]
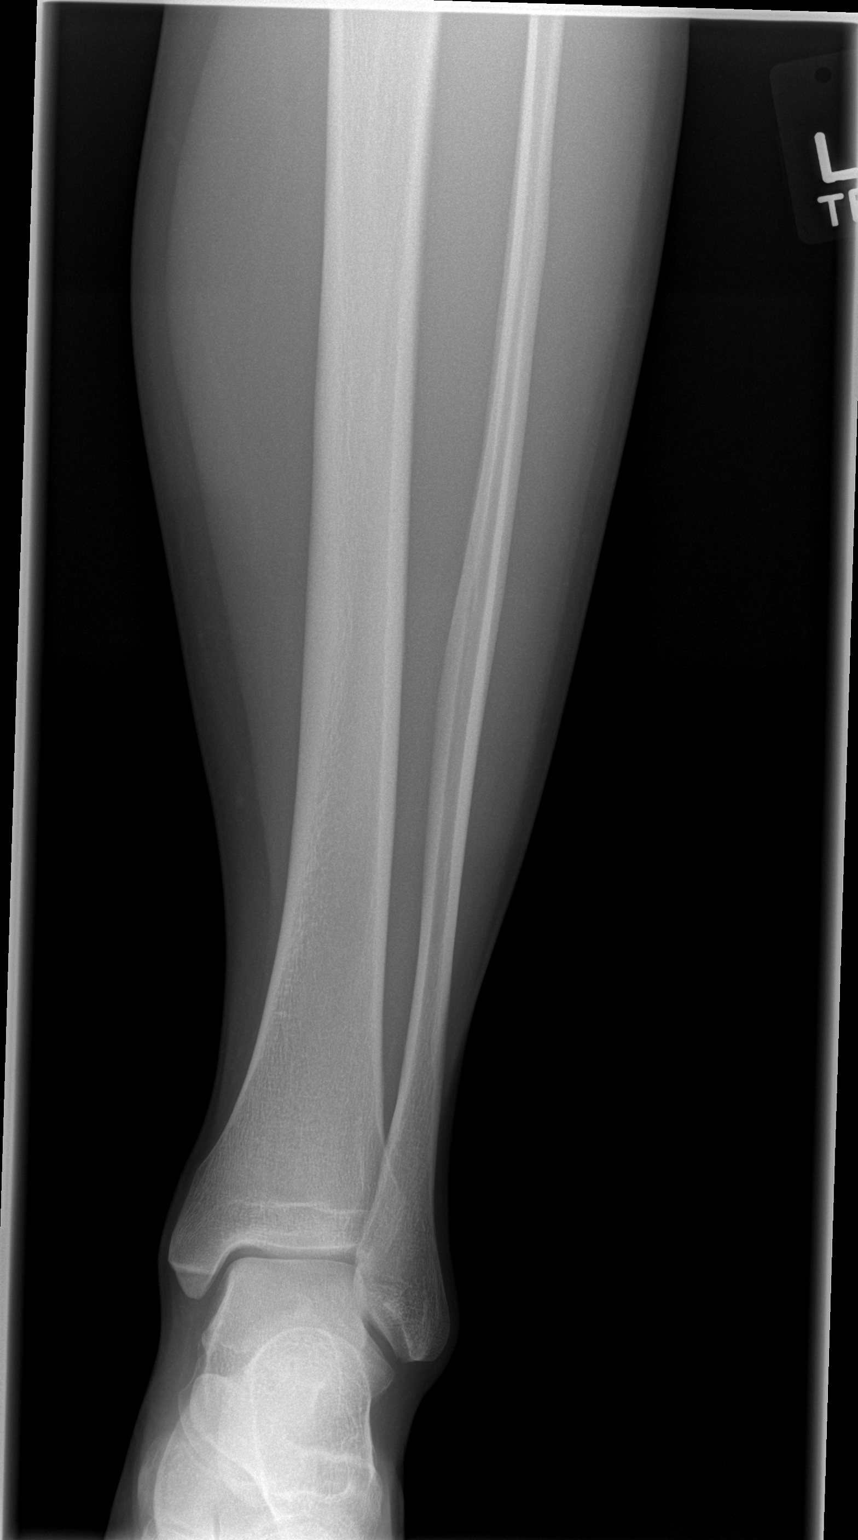

[t tib/fib lat left (1 of 2)]
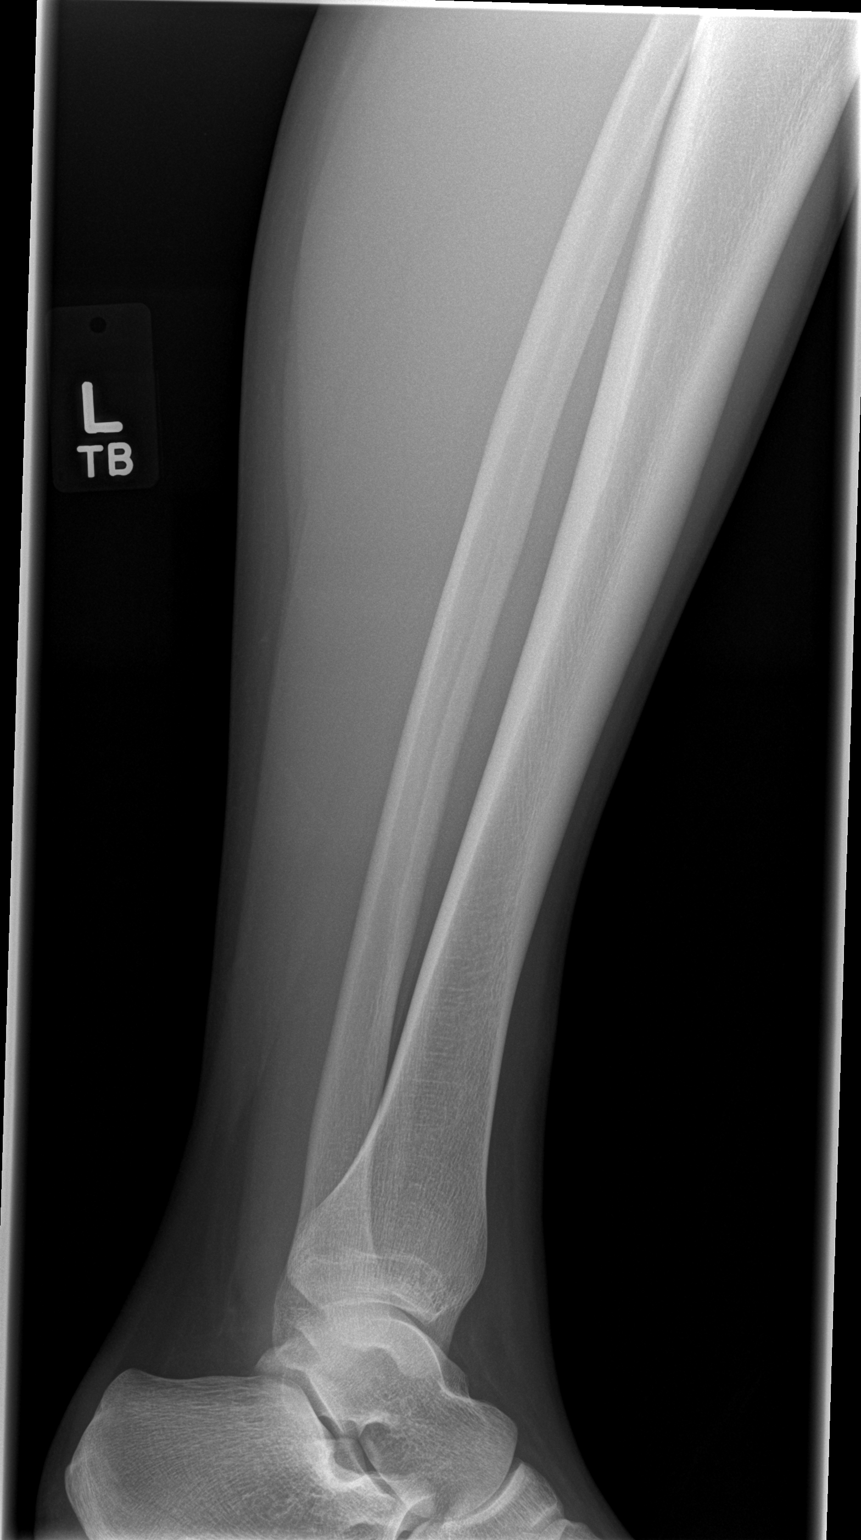

[t tib/fib lat left (2 of 2)]
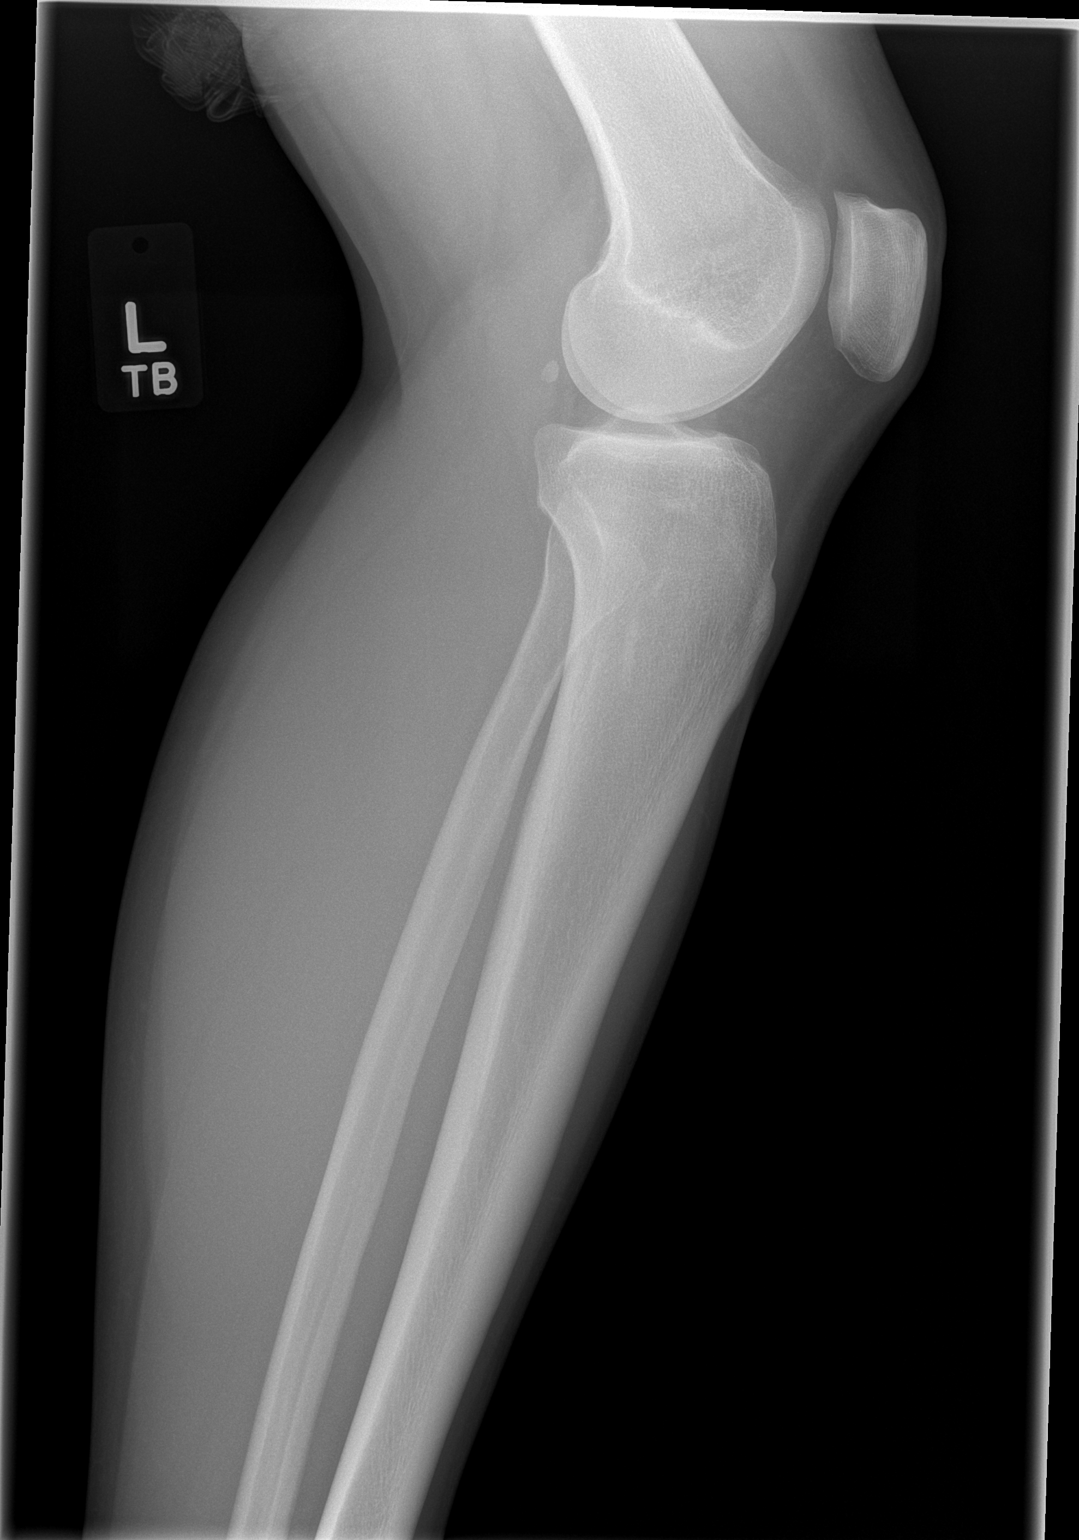

[4 of 4 positions shown; findings below may reference images not displayed]

FINDINGS: A nutrient foramen within the fibular shaft on the first
AP view. No acute fracture or dislocation.  No knee joint effusion.
IMPRESSION: No acute osseous abnormality.

## 2013-06-17 ENCOUNTER — Telehealth: Payer: Self-pay | Admitting: *Deleted

## 2013-06-17 NOTE — Telephone Encounter (Signed)
Pt states that her last visit was billed to wrong insurance should be [employer] # [id number]

## 2013-10-03 ENCOUNTER — Other Ambulatory Visit: Payer: Self-pay | Admitting: *Deleted

## 2013-10-03 DIAGNOSIS — N946 Dysmenorrhea, unspecified: Secondary | ICD-10-CM

## 2013-10-03 NOTE — Telephone Encounter (Signed)
Refill request last CPE in Feb

## 2013-10-06 MED ORDER — DESOGESTREL-ETHINYL ESTRADIOL 0.15-30 MG-MCG PO TABS: 1.0000 | ORAL_TABLET | Freq: Every day | ORAL | Status: DC

## 2013-10-17 ENCOUNTER — Other Ambulatory Visit: Payer: Self-pay | Admitting: Internal Medicine

## 2013-10-17 NOTE — Telephone Encounter (Signed)
Refill request

## 2014-03-05 ENCOUNTER — Other Ambulatory Visit: Payer: Self-pay | Admitting: *Deleted

## 2014-03-05 DIAGNOSIS — N946 Dysmenorrhea, unspecified: Secondary | ICD-10-CM

## 2014-03-05 MED ORDER — DESOGESTREL-ETHINYL ESTRADIOL 0.15-30 MG-MCG PO TABS: 1.0000 | ORAL_TABLET | Freq: Every day | ORAL | Status: DC

## 2014-03-05 NOTE — Telephone Encounter (Signed)
See has a CPE scheduled in June

## 2014-03-05 NOTE — Telephone Encounter (Signed)
Julia Stephens   I have not seen pt since last February  She needs to see me in office I refilled one month with one reill  Message back with appt date

## 2014-03-05 NOTE — Telephone Encounter (Signed)
Refill request CPE in June

## 2014-05-05 ENCOUNTER — Ambulatory Visit (INDEPENDENT_AMBULATORY_CARE_PROVIDER_SITE_OTHER): Payer: BC Managed Care – PPO | Admitting: Internal Medicine

## 2014-05-05 ENCOUNTER — Encounter: Payer: Self-pay | Admitting: Internal Medicine

## 2014-05-05 VITALS — BP 118/70 | HR 84 | Temp 98.2°F | Resp 16 | Ht 68.0 in | Wt 164.0 lb

## 2014-05-05 DIAGNOSIS — R319 Hematuria, unspecified: Secondary | ICD-10-CM | POA: Diagnosis not present

## 2014-05-05 DIAGNOSIS — Z0000 Encounter for general adult medical examination without abnormal findings: Secondary | ICD-10-CM | POA: Diagnosis not present

## 2014-05-05 DIAGNOSIS — N946 Dysmenorrhea, unspecified: Secondary | ICD-10-CM

## 2014-05-05 DIAGNOSIS — R946 Abnormal results of thyroid function studies: Secondary | ICD-10-CM

## 2014-05-05 DIAGNOSIS — IMO0002 Reserved for concepts with insufficient information to code with codable children: Secondary | ICD-10-CM

## 2014-05-05 DIAGNOSIS — R7989 Other specified abnormal findings of blood chemistry: Secondary | ICD-10-CM

## 2014-05-05 DIAGNOSIS — R6889 Other general symptoms and signs: Secondary | ICD-10-CM | POA: Diagnosis not present

## 2014-05-06 ENCOUNTER — Other Ambulatory Visit: Payer: Self-pay | Admitting: Internal Medicine

## 2014-05-06 LAB — CBC WITH DIFFERENTIAL/PLATELET
BASOS ABS: 0.1 10*3/uL (ref 0.0–0.1)
Basophils Relative: 1 % (ref 0–1)
EOS PCT: 2 % (ref 0–5)
Eosinophils Absolute: 0.2 10*3/uL (ref 0.0–0.7)
HEMATOCRIT: 39.9 % (ref 36.0–46.0)
HEMOGLOBIN: 13.9 g/dL (ref 12.0–15.0)
Lymphocytes Relative: 30 % (ref 12–46)
Lymphs Abs: 2.5 10*3/uL (ref 0.7–4.0)
MCH: 31.1 pg (ref 26.0–34.0)
MCHC: 34.8 g/dL (ref 30.0–36.0)
MCV: 89.3 fL (ref 78.0–100.0)
MONO ABS: 0.5 10*3/uL (ref 0.1–1.0)
MONOS PCT: 6 % (ref 3–12)
NEUTROS PCT: 61 % (ref 43–77)
Neutro Abs: 5.1 10*3/uL (ref 1.7–7.7)
Platelets: 193 10*3/uL (ref 150–400)
RBC: 4.47 MIL/uL (ref 3.87–5.11)
RDW: 12.7 % (ref 11.5–15.5)
WBC: 8.4 10*3/uL (ref 4.0–10.5)

## 2014-05-06 LAB — POCT URINALYSIS DIPSTICK
BILIRUBIN UA: NEGATIVE
Blood, UA: NEGATIVE
GLUCOSE UA: NEGATIVE
KETONES UA: NEGATIVE
LEUKOCYTES UA: NEGATIVE
Nitrite, UA: NEGATIVE
PROTEIN UA: NEGATIVE
Spec Grav, UA: 1.02
Urobilinogen, UA: NEGATIVE
pH, UA: 6.5

## 2014-05-06 LAB — LIPID PANEL
CHOLESTEROL: 192 mg/dL (ref 0–200)
HDL: 68 mg/dL (ref >39)
LDL Cholesterol: 96 mg/dL (ref 0–99)
TRIGLYCERIDES: 138 mg/dL (ref <150)
Total CHOL/HDL Ratio: 2.8 Ratio
VLDL: 28 mg/dL (ref 0–40)

## 2014-05-06 LAB — COMPREHENSIVE METABOLIC PANEL
ALBUMIN: 3.9 g/dL (ref 3.5–5.2)
ALK PHOS: 39 U/L (ref 39–117)
ALT: 11 U/L (ref 0–35)
AST: 17 U/L (ref 0–37)
BILIRUBIN TOTAL: 0.4 mg/dL (ref 0.2–1.2)
BUN: 15 mg/dL (ref 6–23)
CO2: 25 mEq/L (ref 19–32)
Calcium: 9.1 mg/dL (ref 8.4–10.5)
Chloride: 103 mEq/L (ref 96–112)
Creat: 0.75 mg/dL (ref 0.50–1.10)
Glucose, Bld: 80 mg/dL (ref 70–99)
POTASSIUM: 4 meq/L (ref 3.5–5.3)
Sodium: 136 mEq/L (ref 135–145)
Total Protein: 6.7 g/dL (ref 6.0–8.3)

## 2014-05-06 LAB — T3, FREE: T3 FREE: 2.6 pg/mL (ref 2.3–4.2)

## 2014-05-06 LAB — T4, FREE: Free T4: 1.08 ng/dL (ref 0.80–1.80)

## 2014-05-06 LAB — TSH: TSH: 1.216 u[IU]/mL (ref 0.350–4.500)

## 2014-05-06 NOTE — Progress Notes (Signed)
Subjective:    Patient ID: Julia Stephens, female    DOB: 06/21/1991, 23 y.o.   MRN: [id number]  HPI Julia Stephens is here for CPE.  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    Menses regular without pain, she is sexually active with one partner - In a 3 year relationship  Low TSH/ subclinical hyperthyroidism  She has been evaluated by endocrinology and I will get repeat labs today.  She is asymptomatic from standpoint of hyperthyroid symptoms  Allergies  Allergen Reactions  . Other Itching    Allergic to cats   Past Medical History  Diagnosis Date  . Dysmenorrhea    Past Surgical History  Procedure Laterality Date  . Colposcopy  01/09/2012    Colpo LGSILw/HPV   History   Social History  . Marital Status: Single    Spouse Name: N/A    Number of Children: N/A  . Years of Education: N/A   Occupational History  . Not on file.   Social History Main Topics  . Smoking status: Never Smoker   . Smokeless tobacco: Never Used  . Alcohol Use: Yes  . Drug Use: No  . Sexual Activity: Yes    Birth Control/ Protection: Condom, Pill   Other Topics Concern  . Not on file   Social History Narrative  . No narrative on file   Family History  Problem Relation Age of Onset  . Polycystic kidney disease Maternal Grandmother   . Diabetes Maternal Grandmother   . Hypertension Maternal Grandmother   . Vision loss Maternal Grandmother   . Diabetes Maternal Grandfather   . Hypertension Maternal Grandfather   . Vision loss Maternal Grandfather   . Hyperlipidemia Father   . Hypertension Father   . Hyperlipidemia Brother   . Hypertension Brother   . Diabetes Paternal Grandfather    Patient Active Problem List   Diagnosis Date Noted  . Low TSH level 02/20/2013  . History of  shingles 01/08/2013  . Low grade squamous intraepithelial dysplasia 12/18/2011  . Family history of polycystic kidney disease 11/14/2011  . Hematuria 11/14/2011  . Dysmenorrhea    Current Outpatient Prescriptions on File Prior to Visit  Medication Sig Dispense Refill  . APRI 0.15-30 MG-MCG tablet TAKE 1 TABLET BY MOUTH DAILY.  28 tablet  6   No current facility-administered medications on file prior to visit.         Review of Systems See HPI    Objective:   Physical Exam Physical Exam  Nursing note and vitals reviewed.  Constitutional: She is oriented to person, place, and time. She appears well-developed and well-nourished.  HENT:  Head: Normocephalic and atraumatic.  Right Ear: Tympanic membrane and ear canal normal. No drainage. Tympanic membrane is not injected and not erythematous.  Left Ear: Tympanic membrane and ear canal normal. No drainage. Tympanic membrane is not injected and not erythematous.  Nose: Nose normal. Right sinus exhibits no maxillary sinus tenderness and no frontal sinus tenderness. Left sinus exhibits no maxillary sinus tenderness and no frontal sinus tenderness.  Mouth/Throat: Oropharynx is clear and moist. No oral lesions. No oropharyngeal exudate.  Eyes: Conjunctivae and EOM are normal. Pupils are equal, round, and reactive to light.  Neck: Normal range of  motion. Neck supple. No JVD present. Carotid bruit is not present. No mass and no thyromegaly present.  Cardiovascular: Normal rate, regular rhythm, S1 normal, S2 normal and intact distal pulses. Exam reveals no gallop and no friction rub.  No murmur heard.  Pulses:  Carotid pulses are 2+ on the right side, and 2+ on the left side.  Dorsalis pedis pulses are 2+ on the right side, and 2+ on the left side.  No carotid bruit. No LE edema  Pulmonary/Chest: Breath sounds normal. She has no wheezes. She has no rales. She exhibits no tenderness.   Breast  No discrete mass no nipple discharge no axillary  adenopathy bilaterally  Abdominal: Soft. Bowel sounds are normal. She exhibits no distension and no mass. There is no hepatosplenomegaly. There is no tenderness. There is no CVA tenderness.  Musculoskeletal: Normal range of motion.  No active synovitis to joints.  Lymphadenopathy:  She has no cervical adenopathy.  She has no axillary adenopathy.  Right: No inguinal and no supraclavicular adenopathy present.  Left: No inguinal and no supraclavicular adenopathy present.  Neurological: She is alert and oriented to person, place, and time. She has normal strength and normal reflexes. She displays no tremor. No cranial nerve deficit or sensory deficit. Coordination and gait normal.  Skin: Skin is warm and dry. No rash noted. No cyanosis. Nails show no clubbing.   Tatoo Right inner arm and umbilical ring  Psychiatric: She has a normal mood and affect. Her speech is normal and behavior is normal. Cognition and memory are normal.          Assessment & Plan:  HM  Pap per GYN  Taught self breast exam,  Advised safe sex practices and lung screening guidelines,  She is a non-smoker  Subclinical hyperthyroidism:  Will check labs today  See note endocrine  Hematuria in past  U/A neg today  Dysmenorrhea  conitnue OC's    See me in one year or prn

## 2014-05-06 NOTE — Addendum Note (Signed)
Addended by: Conley Rolls on: 05/06/2014 03:04 PM   Modules accepted: Orders

## 2014-05-06 NOTE — Patient Instructions (Signed)
See me in one year or  As needed

## 2014-05-07 LAB — VITAMIN D 25 HYDROXY (VIT D DEFICIENCY, FRACTURES): Vit D, 25-Hydroxy: 62 ng/mL (ref 30–89)

## 2014-05-11 ENCOUNTER — Encounter: Payer: Self-pay | Admitting: *Deleted

## 2014-05-13 ENCOUNTER — Telehealth: Payer: Self-pay | Admitting: *Deleted

## 2014-05-13 NOTE — Telephone Encounter (Signed)
Message copied by Conley Rolls on Wed May 13, 2014  9:18 AM ------      Message from: Emi Belfast D      Created: Sun May 10, 2014  6:37 PM       Jolayne Haines            Call K[place] and let her know that her thyroid tests are normal.  Ok to mail to her ------

## 2014-05-13 NOTE — Telephone Encounter (Signed)
Notified pt of lab results via VM message lab results mailed to home address

## 2014-06-09 DIAGNOSIS — Z0000 Encounter for general adult medical examination without abnormal findings: Secondary | ICD-10-CM | POA: Insufficient documentation

## 2014-09-14 ENCOUNTER — Other Ambulatory Visit: Payer: Self-pay | Admitting: *Deleted

## 2014-09-14 ENCOUNTER — Telehealth: Payer: Self-pay

## 2014-09-14 NOTE — Telephone Encounter (Signed)
Refill request

## 2014-09-14 NOTE — Telephone Encounter (Addendum)
[place] [phone] (M)  [id number] - [place], [place] needs refills on her APRI 0.15-30 MG-MCG tablet

## 2014-09-15 MED ORDER — DESOGESTREL-ETHINYL ESTRADIOL 0.15-30 MG-MCG PO TABS: 1.0000 | ORAL_TABLET | Freq: Every day | ORAL | Status: DC

## 2014-10-27 ENCOUNTER — Encounter: Payer: Self-pay | Admitting: Internal Medicine

## 2014-10-27 ENCOUNTER — Ambulatory Visit (INDEPENDENT_AMBULATORY_CARE_PROVIDER_SITE_OTHER): Payer: BC Managed Care – PPO | Admitting: Internal Medicine

## 2014-10-27 VITALS — BP 128/79 | HR 72 | Temp 97.9°F | Resp 16 | Ht 68.0 in | Wt 164.0 lb

## 2014-10-27 DIAGNOSIS — Z23 Encounter for immunization: Secondary | ICD-10-CM

## 2014-10-27 DIAGNOSIS — H8110 Benign paroxysmal vertigo, unspecified ear: Secondary | ICD-10-CM

## 2014-10-27 MED ORDER — MECLIZINE HCL 25 MG PO TABS: 25.0000 mg | ORAL_TABLET | Freq: Three times a day (TID) | ORAL | Status: DC | PRN

## 2014-10-27 NOTE — Patient Instructions (Signed)
Look up you tube to look Epley maneuver  And can try yourself  Go to pharmacy today   Will set up referral to neurorehabilitation

## 2014-10-27 NOTE — Progress Notes (Signed)
   Subjective:    Patient ID: Julia Stephens, female    DOB: 11-30-1991, 23 y.o.   MRN: [id number]  HPI  Julia Stephens is here for acute visit  3 days of dizziness described as a spinning sensation.  Does not feel like "passing out"  No visual or speech change, no muscle weakness.   No N/V no hearing changes   Allergies  Allergen Reactions  . Other Itching    Allergic to cats   Past Medical History  Diagnosis Date  . Dysmenorrhea    Past Surgical History  Procedure Laterality Date  . Colposcopy  01/09/2012    Colpo LGSILw/HPV   History   Social History  . Marital Status: Single    Spouse Name: N/A    Number of Children: N/A  . Years of Education: N/A   Occupational History  . Not on file.   Social History Main Topics  . Smoking status: Never Smoker   . Smokeless tobacco: Never Used  . Alcohol Use: Yes  . Drug Use: No  . Sexual Activity: Yes    Birth Control/ Protection: Condom, Pill   Other Topics Concern  . Not on file   Social History Narrative   Family History  Problem Relation Age of Onset  . Polycystic kidney disease Maternal Grandmother   . Diabetes Maternal Grandmother   . Hypertension Maternal Grandmother   . Vision loss Maternal Grandmother   . Diabetes Maternal Grandfather   . Hypertension Maternal Grandfather   . Vision loss Maternal Grandfather   . Hyperlipidemia Father   . Hypertension Father   . Hyperlipidemia Brother   . Hypertension Brother   . Diabetes Paternal Grandfather    Patient Active Problem List   Diagnosis Date Noted  . Low TSH level 02/20/2013  . History of shingles 01/08/2013  . Low grade squamous intraepithelial dysplasia 12/18/2011  . Family history of polycystic kidney disease 11/14/2011  . Hematuria 11/14/2011  . Dysmenorrhea    Current Outpatient Prescriptions on File Prior to Visit  Medication Sig Dispense Refill  . desogestrel-ethinyl estradiol (APRI) 0.15-30 MG-MCG tablet Take 1 tablet by mouth daily. 28 tablet 6  .  Multiple Vitamins-Minerals (MULTI COMPLETE PO) Take 1 tablet by mouth daily.     No current facility-administered medications on file prior to visit.      Review of Systems    see hPI Objective:   Physical Exam Physical Exam  Nursing note and vitals reviewed.  Constitutional: She is oriented to person, place, and time. She appears well-developed and well-nourished.  HENT:  Head: Normocephalic and atraumatic.  Cardiovascular: Normal rate and regular rhythm. Exam reveals no gallop and no friction rub.  No murmur heard.  Pulmonary/Chest: Breath sounds normal. She has no wheezes. She has no rales.  Neurological: She is alert and oriented to person, place, and time.  CNII-XII slight lateral nystagmus extreme gaze Motor 5/5 UE and LE Sensory intact to microfilament Reflexes 2+ symmetric  Cerebellar intact Skin: Skin is warm and dry.  Psychiatric: She has a normal mood and affect. Her behavior is normal.        Assessment & Plan:  Vertigo  Will set up with vestibular rehab . antivert 25 mg tid prn.  Advised if any worsening to call office

## 2014-10-28 ENCOUNTER — Ambulatory Visit
Payer: BC Managed Care – PPO | Attending: Internal Medicine | Admitting: Rehabilitative and Restorative Service Providers"

## 2014-10-28 ENCOUNTER — Encounter: Payer: Self-pay | Admitting: Rehabilitative and Restorative Service Providers"

## 2014-10-28 DIAGNOSIS — H8111 Benign paroxysmal vertigo, right ear: Secondary | ICD-10-CM | POA: Diagnosis not present

## 2014-10-28 DIAGNOSIS — Z5189 Encounter for other specified aftercare: Secondary | ICD-10-CM | POA: Diagnosis present

## 2014-10-28 NOTE — Patient Instructions (Signed)
Rolling   With pillow under head, start on back. Roll quickly to right. Hold position until symptoms stop, plus 20 seconds. Roll quickly onto left side. Hold position until symptoms stop, plus 20 seconds. Repeat sequence __5__ times per session. Do _2___ sessions per day.  Copyright  VHI. All rights reserved.  Sit to Side-Lying   Sit on edge of bed. 1. Turn head 45 to right. 2. Maintain head position and lie down slowly on left side. Hold until symptoms subside. 3. Sit up slowly. Hold until symptoms subside. 4. Turn head 45 to left. 5. Maintain head position and lie down slowly on right side. Hold until symptoms subside. 6. Sit up slowly. Repeat sequence __5__ times per session. Do _2___ sessions per day.  *DO THESE EXERCISES UNTIL 2 DAYS WITHOUT ANY DIZZINESS.  Copyright  VHI. All rights reserved.

## 2014-10-28 NOTE — Therapy (Signed)
Physical Therapy Evaluation  Patient Details  Name: Julia Stephens MRN: [id number] Date of Birth: 03-20-1991  Encounter Date: 10/28/2014      PT End of Session - 10/28/14 1404    Visit Number 1   Number of Visits 4   Date for PT Re-Evaluation 11/26/14   PT Start Time 0935   PT Stop Time 1015   PT Time Calculation (min) 40 min   Activity Tolerance Patient tolerated treatment well      Past Medical History  Diagnosis Date  . Dysmenorrhea     Past Surgical History  Procedure Laterality Date  . Colposcopy  01/09/2012    Colpo LGSILw/HPV    LMP 10/13/2014  Visit Diagnosis:  BPPV (benign paroxysmal positional vertigo), right - Plan: PT plan of care cert/re-cert      Subjective Assessment - 10/28/14 0938    Symptoms The patient reports sudden onset of room spinning sensation of vertigo when she sat up on 10/23/2014 that lasted for 30 seconds approximately.  She is still noticing continued episodes of dizziness that are related to positional changes when she turns in bed, gets into/out of bed, or when she moves her head up/down.                                                                                                    Patient Stated Goals "help with dizziness/vertigo"   Currently in Pain? No/denies   Multiple Pain Sites No     Pt has (+) family history (mother) with migraines.  She is negative for migraines, auditory/visual changes.  POSITIONAL TESTING: Negative bilateral dix hallpike for nystagmus with sensation that symptoms may come on with L positional testing.   Negative bilateral sidelying testing for nystagmus, however mild sensation of spinning with return to sit from L sidelying.  Bilateral horizontal roll test negative for nystagmus, however she reports a trace of subjective dizziness worse with R rolling.  Oculomotor Testing: Gaze x 1 viewing at self regulated pace provokes 5/10 dizziness.    CANOLITH REPOSITIONING TECHNIQUE: L Epley's maneuver performed x  2 repetitions based on patient subjective reports with initial positional testing (possibly due to meclizine use last night and suppression of nystagmus).  *After treating the Left side, the patient was educated in HEP for bilateral sidelying (brandt daroff habituation) and R upbeat/rotary nystagmus x 20 seconds noted with R sidelying, therefore, R Epley's performed x 2 reps with resolution of symptoms second repetition.   NEUROMUSCULAR RE-EDUCATION: Nestor Lewandowsky habituation performed for sit<>sidelying and horizontal roll habituation instructed.  Dizziness Handicap Index=35%.     PT Education - 10/28/14 1010    Education provided Yes   Education Details HEP: brandt daroff habituation and horizontal rolling habituation.   Person(s) Educated Patient   Methods Explanation;Demonstration   Comprehension Verbalized understanding            PT Long Term Goals - 10/28/14 1406    PT LONG TERM GOAL #1   Title The patient will be negative for positional testing for BPPV.   Baseline 11/26/2014   Time 4  Period Weeks   Status New   PT LONG TERM GOAL #2   Title The patient will return demo HEP for habituation and gaze.   Baseline 11/26/2014   Time 4   Period Weeks   Status New   PT LONG TERM GOAL #3   Title The patient will improve DHI by 15%.   Baseline 11/26/2014   Time 4   Period Weeks   Status New          Plan - 10/28/14 1405    Clinical Impression Statement The patient is a 23 yo presenting with positive R positional testing today indicating R posterior canalithiasis.  She was treated with Epley's canolith repositioning and HEP provided.   Pt will benefit from skilled therapeutic intervention in order to improve on the following deficits Decreased mobility  due to vertigo   Rehab Potential Good   PT Frequency 1x / week   PT Duration 4 weeks  currently scheduled 1 f/u visit as needed   PT Treatment/Interventions Neuromuscular re-education;Therapeutic  exercise;Therapeutic activities;Patient/family education   PT Next Visit Plan re-check R BPPV   PT Home Exercise Plan add gaze x 1 viewing activities   Consulted and Agree with Plan of Care Patient        Problem List Patient Active Problem List   Diagnosis Date Noted  . Low TSH level 02/20/2013  . History of shingles 01/08/2013  . Low grade squamous intraepithelial dysplasia 12/18/2011  . Family history of polycystic kidney disease 11/14/2011  . Hematuria 11/14/2011  . Dysmenorrhea               Vestibular Assessment - 10/28/14 0941    Type of Dizziness Spinning   Frequency of Dizziness --  daily, multiple times per day   Duration of Dizziness seconds   Aggravating Factors Looking up to the ceiling;Turning head quickly;Mornings;Lying supine   Relieving Factors Slow movements   Dix-Hallpike Dix-Hallpike Right   Sidelying Test Sidelying Right   Horizontal Canal Testing Horizontal Canal Right   Dix-Hallpike Right Duration --  No nystagmus viewed with bilateral dix hallpike   Dix-Hallpike Right Symptoms --  No sensation of spinning today with bilateral dix hallpike   Sidelying Right Duration --  No nystagmus or dizziness noted to right                                        Taila Basinski 10/28/2014, 2:20 PM

## 2014-11-10 ENCOUNTER — Ambulatory Visit: Payer: BC Managed Care – PPO | Admitting: Rehabilitative and Restorative Service Providers"

## 2014-12-10 ENCOUNTER — Encounter: Payer: Self-pay | Admitting: Rehabilitative and Restorative Service Providers"

## 2014-12-10 NOTE — Therapy (Signed)
[facility] [address] [place], [place], [address] Phone: [phone]   Fax:  [phone]  Patient Details  Name: Julia Stephens MRN: [id number] Date of Birth: Mar 13, 1991 Referring Provider:  No ref. provider found  DISCHARGE SUMMARY:  The patient was treated on 10/28/14 and did not return for further PT visits.  Please see initial evaluation note for further patient status.  Thank you for the referral of this patient.   [place], [place] 12/10/2014, 2:12 PM  [facility] [address] [place], [place], [address] Phone: [phone]   Fax:  [phone]

## 2015-03-18 ENCOUNTER — Other Ambulatory Visit: Payer: Self-pay | Admitting: *Deleted

## 2015-03-18 MED ORDER — DESOGESTREL-ETHINYL ESTRADIOL 0.15-30 MG-MCG PO TABS: 1.0000 | ORAL_TABLET | Freq: Every day | ORAL | Status: DC

## 2015-03-18 NOTE — Telephone Encounter (Signed)
Refill request

## 2015-03-29 ENCOUNTER — Other Ambulatory Visit: Payer: Self-pay | Admitting: Internal Medicine

## 2017-01-04 DIAGNOSIS — Z01419 Encounter for gynecological examination (general) (routine) without abnormal findings: Secondary | ICD-10-CM | POA: Diagnosis not present

## 2017-02-06 DIAGNOSIS — Z7689 Persons encountering health services in other specified circumstances: Secondary | ICD-10-CM | POA: Diagnosis not present

## 2017-02-06 DIAGNOSIS — J0140 Acute pansinusitis, unspecified: Secondary | ICD-10-CM | POA: Diagnosis not present

## 2017-02-23 DIAGNOSIS — E0590 Thyrotoxicosis, unspecified without thyrotoxic crisis or storm: Secondary | ICD-10-CM | POA: Diagnosis not present

## 2017-02-23 DIAGNOSIS — Z0000 Encounter for general adult medical examination without abnormal findings: Secondary | ICD-10-CM | POA: Diagnosis not present

## 2017-02-23 DIAGNOSIS — D229 Melanocytic nevi, unspecified: Secondary | ICD-10-CM | POA: Diagnosis not present

## 2017-04-17 DIAGNOSIS — D2262 Melanocytic nevi of left upper limb, including shoulder: Secondary | ICD-10-CM | POA: Diagnosis not present

## 2017-04-17 DIAGNOSIS — D225 Melanocytic nevi of trunk: Secondary | ICD-10-CM | POA: Diagnosis not present

## 2017-04-17 DIAGNOSIS — I781 Nevus, non-neoplastic: Secondary | ICD-10-CM | POA: Diagnosis not present

## 2017-04-17 DIAGNOSIS — D485 Neoplasm of uncertain behavior of skin: Secondary | ICD-10-CM | POA: Diagnosis not present

## 2017-10-02 DIAGNOSIS — R300 Dysuria: Secondary | ICD-10-CM | POA: Diagnosis not present

## 2017-10-02 DIAGNOSIS — N3090 Cystitis, unspecified without hematuria: Secondary | ICD-10-CM | POA: Diagnosis not present

## 2017-12-12 LAB — OB RESULTS CONSOLE RUBELLA ANTIBODY, IGM: Rubella: IMMUNE

## 2017-12-12 LAB — OB RESULTS CONSOLE HEPATITIS B SURFACE ANTIGEN: HEP B S AG: NEGATIVE

## 2017-12-12 LAB — OB RESULTS CONSOLE RPR: RPR: NONREACTIVE

## 2017-12-12 LAB — OB RESULTS CONSOLE GC/CHLAMYDIA
Chlamydia: NEGATIVE
Gonorrhea: NEGATIVE

## 2017-12-12 LAB — OB RESULTS CONSOLE HIV ANTIBODY (ROUTINE TESTING): HIV: NONREACTIVE

## 2017-12-12 LAB — OB RESULTS CONSOLE ABO/RH: RH Type: NEGATIVE

## 2017-12-12 LAB — OB RESULTS CONSOLE ANTIBODY SCREEN: Antibody Screen: NEGATIVE

## 2018-04-15 DIAGNOSIS — Z3480 Encounter for supervision of other normal pregnancy, unspecified trimester: Secondary | ICD-10-CM | POA: Diagnosis not present

## 2018-04-15 DIAGNOSIS — Z23 Encounter for immunization: Secondary | ICD-10-CM | POA: Diagnosis not present

## 2018-04-25 DIAGNOSIS — O99810 Abnormal glucose complicating pregnancy: Secondary | ICD-10-CM | POA: Diagnosis not present

## 2018-05-31 DIAGNOSIS — Z3482 Encounter for supervision of other normal pregnancy, second trimester: Secondary | ICD-10-CM | POA: Diagnosis not present

## 2018-05-31 DIAGNOSIS — Z3483 Encounter for supervision of other normal pregnancy, third trimester: Secondary | ICD-10-CM | POA: Diagnosis not present

## 2018-07-10 ENCOUNTER — Telehealth (HOSPITAL_COMMUNITY): Payer: Self-pay | Admitting: *Deleted

## 2018-07-10 ENCOUNTER — Encounter (HOSPITAL_COMMUNITY): Payer: Self-pay | Admitting: *Deleted

## 2018-07-10 LAB — OB RESULTS CONSOLE GBS: GBS: NEGATIVE

## 2018-07-10 NOTE — Telephone Encounter (Signed)
Preadmission screen  

## 2018-07-15 ENCOUNTER — Inpatient Hospital Stay (HOSPITAL_COMMUNITY): Admission: AD | Admit: 2018-07-15 | Payer: Self-pay | Source: Ambulatory Visit | Admitting: Obstetrics and Gynecology

## 2018-07-17 DIAGNOSIS — Z3A40 40 weeks gestation of pregnancy: Secondary | ICD-10-CM | POA: Diagnosis not present

## 2018-07-17 DIAGNOSIS — O480 Post-term pregnancy: Secondary | ICD-10-CM | POA: Diagnosis not present

## 2018-07-19 NOTE — H&P (Signed)
Julia Stephens is a 27 y.o. female presenting for post dates IIOL. U/S in office 07/17/18=EFW 7# 13oz, vtx AFI 58%. OB History    Gravida  1   Para  0   Term      Preterm      AB      Living        SAB      TAB      Ectopic      Multiple      Live Births             Past Medical History:  Diagnosis Date  . Dysmenorrhea   . Vaginal Pap smear, abnormal    Past Surgical History:  Procedure Laterality Date  . COLPOSCOPY  01/09/2012   Colpo LGSILw/HPV  . WISDOM TOOTH EXTRACTION     Family History: family history includes Diabetes in her maternal grandfather, maternal grandmother, and paternal grandfather; Hyperlipidemia in her brother and father; Hypertension in her brother, father, maternal grandfather, maternal grandmother, paternal grandfather, and paternal grandmother; Polycystic kidney disease in her maternal grandmother; Vision loss in her maternal grandfather and maternal grandmother. Social History:  reports that she has never smoked. She has never used smokeless tobacco. She reports that she drinks alcohol. She reports that she does not use drugs.     Maternal Diabetes: No Genetic Screening: Normal Maternal Ultrasounds/Referrals: Normal Fetal Ultrasounds or other Referrals:  None Maternal Substance Abuse:  No Significant Maternal Medications:  None Significant Maternal Lab Results:  None Other Comments:  None  Review of Systems  Eyes: Negative for blurred vision.  Gastrointestinal: Negative for abdominal pain.  Neurological: Negative for headaches.   History   Last menstrual period 10/08/2017. Maternal Exam:  Abdomen: Fetal presentation: vertex     Physical Exam  Cardiovascular: Normal rate and regular rhythm.  Respiratory: Effort normal and breath sounds normal.  GI: Soft.  Neurological: She has normal reflexes.    Prenatal labs: ABO, Rh: AB/Negative/-- (01/09 0000) Antibody: Negative (01/09 0000) Rubella: Immune (01/09 0000) RPR:  Nonreactive (01/09 0000)  HBsAg: Negative (01/09 0000)  HIV: Non-reactive (01/09 0000)  GBS: Negative (08/07 0000)   Assessment/Plan: 27 yo G1P0 @ 41 weeks for 2 stage IOL   Shon Millet II 07/19/2018, 2:19 PM

## 2018-07-22 ENCOUNTER — Inpatient Hospital Stay (HOSPITAL_COMMUNITY): Payer: 59 | Admitting: Anesthesiology

## 2018-07-22 ENCOUNTER — Inpatient Hospital Stay (HOSPITAL_COMMUNITY)
Admission: RE | Admit: 2018-07-22 | Discharge: 2018-07-24 | DRG: 788 | Disposition: A | Discharge status: Home / Self Care | Payer: 59 | Attending: Obstetrics and Gynecology | Admitting: Obstetrics and Gynecology

## 2018-07-22 ENCOUNTER — Other Ambulatory Visit: Payer: Self-pay

## 2018-07-22 ENCOUNTER — Encounter (HOSPITAL_COMMUNITY)
Admission: RE | Disposition: A | Discharge status: Home / Self Care | Payer: Self-pay | Source: Home / Self Care | Attending: Obstetrics and Gynecology

## 2018-07-22 ENCOUNTER — Encounter (HOSPITAL_COMMUNITY): Payer: Self-pay

## 2018-07-22 DIAGNOSIS — O480 Post-term pregnancy: Principal | ICD-10-CM | POA: Diagnosis present

## 2018-07-22 DIAGNOSIS — Z3A41 41 weeks gestation of pregnancy: Secondary | ICD-10-CM

## 2018-07-22 LAB — CBC
HCT: 37.7 % (ref 36.0–46.0)
HCT: 38.6 % (ref 36.0–46.0)
HEMOGLOBIN: 13.4 g/dL (ref 12.0–15.0)
Hemoglobin: 13 g/dL (ref 12.0–15.0)
MCH: 32.1 pg (ref 26.0–34.0)
MCH: 32.2 pg (ref 26.0–34.0)
MCHC: 34.5 g/dL (ref 30.0–36.0)
MCHC: 34.7 g/dL (ref 30.0–36.0)
MCV: 93.1 fL (ref 78.0–100.0)
MCV: 92.8 fL (ref 78.0–100.0)
PLATELETS: 119 10*3/uL — AB (ref 150–400)
PLATELETS: 126 10*3/uL — AB (ref 150–400)
RBC: 4.05 MIL/uL (ref 3.87–5.11)
RBC: 4.16 MIL/uL (ref 3.87–5.11)
RDW: 13.6 % (ref 11.5–15.5)
RDW: 14 % (ref 11.5–15.5)
WBC: 12 10*3/uL — AB (ref 4.0–10.5)
WBC: 15.1 10*3/uL — ABNORMAL HIGH (ref 4.0–10.5)

## 2018-07-22 LAB — RPR: RPR Ser Ql: NONREACTIVE

## 2018-07-22 LAB — ABO/RH: ABO/RH(D): AB NEG

## 2018-07-22 LAB — TYPE AND SCREEN
ABO/RH(D): AB NEG
ANTIBODY SCREEN: NEGATIVE

## 2018-07-22 SURGERY — Surgical Case
Anesthesia: Epidural

## 2018-07-22 MED ORDER — PRENATAL MULTIVITAMIN CH
1.0000 | ORAL_TABLET | Freq: Every day | ORAL | Status: DC
  Administered 2018-07-23 – 2018-07-24 (×2): 1 via ORAL
  Filled 2018-07-22 (×2): qty 1

## 2018-07-22 MED ORDER — DIBUCAINE 1 % RE OINT: 1.0000 "application " | TOPICAL_OINTMENT | RECTAL | Status: DC | PRN

## 2018-07-22 MED ORDER — OXYTOCIN 40 UNITS IN LACTATED RINGERS INFUSION - SIMPLE MED: 2.5000 [IU]/h | INTRAVENOUS | Status: AC

## 2018-07-22 MED ORDER — CHLOROPROCAINE HCL (PF) 3 % IJ SOLN
INTRAMUSCULAR | Status: AC
  Filled 2018-07-22: qty 20

## 2018-07-22 MED ORDER — OXYTOCIN 10 UNIT/ML IJ SOLN
INTRAMUSCULAR | Status: AC
  Filled 2018-07-22: qty 4

## 2018-07-22 MED ORDER — DIPHENHYDRAMINE HCL 25 MG PO CAPS: 25.0000 mg | ORAL_CAPSULE | ORAL | Status: DC | PRN

## 2018-07-22 MED ORDER — CEFAZOLIN SODIUM-DEXTROSE 2-4 GM/100ML-% IV SOLN
INTRAVENOUS | Status: AC
  Filled 2018-07-22: qty 100

## 2018-07-22 MED ORDER — EPHEDRINE 5 MG/ML INJ: 10.0000 mg | INTRAVENOUS | Status: DC | PRN

## 2018-07-22 MED ORDER — OXYCODONE HCL 5 MG PO TABS: 10.0000 mg | ORAL_TABLET | ORAL | Status: DC | PRN

## 2018-07-22 MED ORDER — MENTHOL 3 MG MT LOZG: 1.0000 | LOZENGE | OROMUCOSAL | Status: DC | PRN

## 2018-07-22 MED ORDER — SIMETHICONE 80 MG PO CHEW: 80.0000 mg | CHEWABLE_TABLET | ORAL | Status: DC | PRN

## 2018-07-22 MED ORDER — IBUPROFEN 600 MG PO TABS
600.0000 mg | ORAL_TABLET | Freq: Four times a day (QID) | ORAL | Status: DC
  Administered 2018-07-23 – 2018-07-24 (×7): 600 mg via ORAL
  Filled 2018-07-22 (×7): qty 1

## 2018-07-22 MED ORDER — SENNOSIDES-DOCUSATE SODIUM 8.6-50 MG PO TABS
2.0000 | ORAL_TABLET | ORAL | Status: DC
  Administered 2018-07-23 (×2): 2 via ORAL
  Filled 2018-07-22 (×2): qty 2

## 2018-07-22 MED ORDER — ONDANSETRON HCL 4 MG/2ML IJ SOLN
INTRAMUSCULAR | Status: AC
  Filled 2018-07-22: qty 2

## 2018-07-22 MED ORDER — NALBUPHINE HCL 10 MG/ML IJ SOLN: 5.0000 mg | Freq: Once | INTRAMUSCULAR | Status: DC | PRN

## 2018-07-22 MED ORDER — OXYTOCIN BOLUS FROM INFUSION: 500.0000 mL | Freq: Once | INTRAVENOUS | Status: DC

## 2018-07-22 MED ORDER — LACTATED RINGERS IV SOLN
INTRAVENOUS | Status: DC
  Administered 2018-07-22 – 2018-07-23 (×2): via INTRAVENOUS

## 2018-07-22 MED ORDER — SOD CITRATE-CITRIC ACID 500-334 MG/5ML PO SOLN
30.0000 mL | ORAL | Status: DC | PRN
  Administered 2018-07-22: 30 mL via ORAL
  Filled 2018-07-22: qty 15

## 2018-07-22 MED ORDER — OXYCODONE-ACETAMINOPHEN 5-325 MG PO TABS: 1.0000 | ORAL_TABLET | ORAL | Status: DC | PRN

## 2018-07-22 MED ORDER — LIDOCAINE HCL (PF) 1 % IJ SOLN
30.0000 mL | INTRAMUSCULAR | Status: DC | PRN
  Filled 2018-07-22: qty 30

## 2018-07-22 MED ORDER — ZOLPIDEM TARTRATE 5 MG PO TABS: 5.0000 mg | ORAL_TABLET | Freq: Every evening | ORAL | Status: DC | PRN

## 2018-07-22 MED ORDER — ONDANSETRON HCL 4 MG/2ML IJ SOLN: 4.0000 mg | Freq: Three times a day (TID) | INTRAMUSCULAR | Status: DC | PRN

## 2018-07-22 MED ORDER — MEPERIDINE HCL 25 MG/ML IJ SOLN: 6.2500 mg | INTRAMUSCULAR | Status: DC | PRN

## 2018-07-22 MED ORDER — DIPHENHYDRAMINE HCL 25 MG PO CAPS: 25.0000 mg | ORAL_CAPSULE | Freq: Four times a day (QID) | ORAL | Status: DC | PRN

## 2018-07-22 MED ORDER — LACTATED RINGERS IV SOLN
INTRAVENOUS | Status: DC | PRN
  Administered 2018-07-22: 18:00:00 via INTRAVENOUS

## 2018-07-22 MED ORDER — PHENYLEPHRINE 40 MCG/ML (10ML) SYRINGE FOR IV PUSH (FOR BLOOD PRESSURE SUPPORT)
80.0000 ug | PREFILLED_SYRINGE | INTRAVENOUS | Status: DC | PRN
  Filled 2018-07-22: qty 10

## 2018-07-22 MED ORDER — ONDANSETRON HCL 4 MG/2ML IJ SOLN: 4.0000 mg | Freq: Four times a day (QID) | INTRAMUSCULAR | Status: DC | PRN

## 2018-07-22 MED ORDER — FENTANYL CITRATE (PF) 100 MCG/2ML IJ SOLN: 25.0000 ug | INTRAMUSCULAR | Status: DC | PRN

## 2018-07-22 MED ORDER — WITCH HAZEL-GLYCERIN EX PADS: 1.0000 "application " | MEDICATED_PAD | CUTANEOUS | Status: DC | PRN

## 2018-07-22 MED ORDER — OXYCODONE-ACETAMINOPHEN 5-325 MG PO TABS: 2.0000 | ORAL_TABLET | ORAL | Status: DC | PRN

## 2018-07-22 MED ORDER — SODIUM BICARBONATE 8.4 % IV SOLN
INTRAVENOUS | Status: AC
  Filled 2018-07-22: qty 50

## 2018-07-22 MED ORDER — ONDANSETRON HCL 4 MG/2ML IJ SOLN
INTRAMUSCULAR | Status: DC | PRN
  Administered 2018-07-22: 4 mg via INTRAVENOUS

## 2018-07-22 MED ORDER — FLEET ENEMA 7-19 GM/118ML RE ENEM: 1.0000 | ENEMA | RECTAL | Status: DC | PRN

## 2018-07-22 MED ORDER — MISOPROSTOL 25 MCG QUARTER TABLET
25.0000 ug | ORAL_TABLET | ORAL | Status: DC | PRN
  Administered 2018-07-22 (×3): 25 ug via VAGINAL
  Filled 2018-07-22 (×3): qty 1

## 2018-07-22 MED ORDER — ACETAMINOPHEN 325 MG PO TABS
650.0000 mg | ORAL_TABLET | ORAL | Status: DC | PRN
  Administered 2018-07-24: 650 mg via ORAL
  Filled 2018-07-22: qty 2

## 2018-07-22 MED ORDER — LACTATED RINGERS IV SOLN
500.0000 mL | INTRAVENOUS | Status: DC | PRN
  Administered 2018-07-22: 500 mL via INTRAVENOUS

## 2018-07-22 MED ORDER — SIMETHICONE 80 MG PO CHEW
80.0000 mg | CHEWABLE_TABLET | Freq: Three times a day (TID) | ORAL | Status: DC
  Administered 2018-07-23 – 2018-07-24 (×4): 80 mg via ORAL
  Filled 2018-07-22 (×4): qty 1

## 2018-07-22 MED ORDER — OXYCODONE HCL 5 MG PO TABS
5.0000 mg | ORAL_TABLET | ORAL | Status: DC | PRN
  Administered 2018-07-23 – 2018-07-24 (×3): 5 mg via ORAL
  Filled 2018-07-22 (×3): qty 1

## 2018-07-22 MED ORDER — SODIUM CHLORIDE 0.9 % IR SOLN
Status: DC | PRN
  Administered 2018-07-22: 1000 mL

## 2018-07-22 MED ORDER — SODIUM CHLORIDE 0.9% FLUSH: 3.0000 mL | INTRAVENOUS | Status: DC | PRN

## 2018-07-22 MED ORDER — ACETAMINOPHEN 325 MG PO TABS: 650.0000 mg | ORAL_TABLET | ORAL | Status: DC | PRN

## 2018-07-22 MED ORDER — BUTORPHANOL TARTRATE 1 MG/ML IJ SOLN: 1.0000 mg | INTRAMUSCULAR | Status: DC | PRN

## 2018-07-22 MED ORDER — DIPHENHYDRAMINE HCL 50 MG/ML IJ SOLN: 12.5000 mg | INTRAMUSCULAR | Status: DC | PRN

## 2018-07-22 MED ORDER — PROMETHAZINE HCL 25 MG/ML IJ SOLN: 6.2500 mg | INTRAMUSCULAR | Status: DC | PRN

## 2018-07-22 MED ORDER — NALOXONE HCL 0.4 MG/ML IJ SOLN: 0.4000 mg | INTRAMUSCULAR | Status: DC | PRN

## 2018-07-22 MED ORDER — OXYTOCIN 40 UNITS IN LACTATED RINGERS INFUSION - SIMPLE MED
2.5000 [IU]/h | INTRAVENOUS | Status: DC
  Filled 2018-07-22: qty 1000

## 2018-07-22 MED ORDER — MORPHINE SULFATE (PF) 0.5 MG/ML IJ SOLN
INTRAMUSCULAR | Status: AC
  Filled 2018-07-22: qty 10

## 2018-07-22 MED ORDER — SODIUM BICARBONATE 8.4 % IV SOLN
INTRAVENOUS | Status: DC | PRN
  Administered 2018-07-22: 10 mL via EPIDURAL

## 2018-07-22 MED ORDER — SCOPOLAMINE 1 MG/3DAYS TD PT72
MEDICATED_PATCH | TRANSDERMAL | Status: DC | PRN
  Administered 2018-07-22: 1 via TRANSDERMAL

## 2018-07-22 MED ORDER — PHENYLEPHRINE 40 MCG/ML (10ML) SYRINGE FOR IV PUSH (FOR BLOOD PRESSURE SUPPORT)
PREFILLED_SYRINGE | INTRAVENOUS | Status: AC
  Filled 2018-07-22: qty 20

## 2018-07-22 MED ORDER — ACETAMINOPHEN 500 MG PO TABS
ORAL_TABLET | ORAL | Status: AC
  Administered 2018-07-22: 1000 mg via ORAL
  Filled 2018-07-22: qty 2

## 2018-07-22 MED ORDER — LACTATED RINGERS IV SOLN
INTRAVENOUS | Status: DC
  Administered 2018-07-22 (×3): via INTRAVENOUS

## 2018-07-22 MED ORDER — COCONUT OIL OIL
1.0000 "application " | TOPICAL_OIL | Status: DC | PRN
  Filled 2018-07-22: qty 120

## 2018-07-22 MED ORDER — PHENYLEPHRINE 40 MCG/ML (10ML) SYRINGE FOR IV PUSH (FOR BLOOD PRESSURE SUPPORT)
PREFILLED_SYRINGE | INTRAVENOUS | Status: DC | PRN
  Administered 2018-07-22 (×4): 80 ug via INTRAVENOUS

## 2018-07-22 MED ORDER — NALBUPHINE HCL 10 MG/ML IJ SOLN: 5.0000 mg | INTRAMUSCULAR | Status: DC | PRN

## 2018-07-22 MED ORDER — FENTANYL 2.5 MCG/ML BUPIVACAINE 1/10 % EPIDURAL INFUSION (WH - ANES)
14.0000 mL/h | INTRAMUSCULAR | Status: DC | PRN
  Administered 2018-07-22: 14 mL/h via EPIDURAL
  Filled 2018-07-22: qty 100

## 2018-07-22 MED ORDER — NALOXONE HCL 4 MG/10ML IJ SOLN: 1.0000 ug/kg/h | INTRAVENOUS | Status: DC | PRN

## 2018-07-22 MED ORDER — PHENYLEPHRINE HCL 10 MG/ML IJ SOLN
INTRAMUSCULAR | Status: DC | PRN
  Administered 2018-07-22 (×2): 80 ug via INTRAVENOUS
  Administered 2018-07-22: 120 ug via INTRAVENOUS

## 2018-07-22 MED ORDER — TERBUTALINE SULFATE 1 MG/ML IJ SOLN: 0.2500 mg | Freq: Once | INTRAMUSCULAR | Status: DC | PRN

## 2018-07-22 MED ORDER — TETANUS-DIPHTH-ACELL PERTUSSIS 5-2.5-18.5 LF-MCG/0.5 IM SUSP: 0.5000 mL | Freq: Once | INTRAMUSCULAR | Status: DC

## 2018-07-22 MED ORDER — SCOPOLAMINE 1 MG/3DAYS TD PT72
MEDICATED_PATCH | TRANSDERMAL | Status: AC
  Filled 2018-07-22: qty 1

## 2018-07-22 MED ORDER — ACETAMINOPHEN 500 MG PO TABS
1000.0000 mg | ORAL_TABLET | Freq: Four times a day (QID) | ORAL | Status: AC
  Administered 2018-07-22 – 2018-07-23 (×4): 1000 mg via ORAL
  Filled 2018-07-22 (×3): qty 2

## 2018-07-22 MED ORDER — SIMETHICONE 80 MG PO CHEW
80.0000 mg | CHEWABLE_TABLET | ORAL | Status: DC
  Administered 2018-07-23 (×2): 80 mg via ORAL
  Filled 2018-07-22 (×2): qty 1

## 2018-07-22 MED ORDER — OXYTOCIN 10 UNIT/ML IJ SOLN
INTRAVENOUS | Status: DC | PRN
  Administered 2018-07-22: 40 [IU] via INTRAVENOUS

## 2018-07-22 MED ORDER — CEFAZOLIN SODIUM-DEXTROSE 2-3 GM-%(50ML) IV SOLR
INTRAVENOUS | Status: DC | PRN
  Administered 2018-07-22: 2 g via INTRAVENOUS

## 2018-07-22 MED ORDER — LACTATED RINGERS IV SOLN
500.0000 mL | Freq: Once | INTRAVENOUS | Status: AC
  Administered 2018-07-22: 500 mL via INTRAVENOUS

## 2018-07-22 MED ORDER — PHENYLEPHRINE 40 MCG/ML (10ML) SYRINGE FOR IV PUSH (FOR BLOOD PRESSURE SUPPORT): 80.0000 ug | PREFILLED_SYRINGE | INTRAVENOUS | Status: DC | PRN

## 2018-07-22 MED ORDER — SCOPOLAMINE 1 MG/3DAYS TD PT72: 1.0000 | MEDICATED_PATCH | Freq: Once | TRANSDERMAL | Status: DC

## 2018-07-22 MED ORDER — KETOROLAC TROMETHAMINE 30 MG/ML IJ SOLN: 30.0000 mg | Freq: Four times a day (QID) | INTRAMUSCULAR | Status: AC | PRN

## 2018-07-22 MED ORDER — MORPHINE SULFATE (PF) 0.5 MG/ML IJ SOLN
INTRAMUSCULAR | Status: DC | PRN
  Administered 2018-07-22: 3 mg via EPIDURAL

## 2018-07-22 MED ORDER — LIDOCAINE HCL (PF) 1 % IJ SOLN
INTRAMUSCULAR | Status: DC | PRN
  Administered 2018-07-22: 13 mL via EPIDURAL

## 2018-07-22 MED ORDER — LIDOCAINE-EPINEPHRINE (PF) 2 %-1:200000 IJ SOLN
INTRAMUSCULAR | Status: AC
  Filled 2018-07-22: qty 20

## 2018-07-22 SURGICAL SUPPLY — 32 items
BENZOIN TINCTURE PRP APPL 2/3 (GAUZE/BANDAGES/DRESSINGS) ×2 IMPLANT
CHLORAPREP W/TINT 26ML (MISCELLANEOUS) ×2 IMPLANT
CLAMP CORD UMBIL (MISCELLANEOUS) IMPLANT
CLOTH BEACON ORANGE TIMEOUT ST (SAFETY) ×2 IMPLANT
DERMABOND ADVANCED (GAUZE/BANDAGES/DRESSINGS)
DERMABOND ADVANCED .7 DNX12 (GAUZE/BANDAGES/DRESSINGS) IMPLANT
DRSG OPSITE POSTOP 4X10 (GAUZE/BANDAGES/DRESSINGS) ×2 IMPLANT
ELECT REM PT RETURN 9FT ADLT (ELECTROSURGICAL) ×2
ELECTRODE REM PT RTRN 9FT ADLT (ELECTROSURGICAL) ×1 IMPLANT
EXTRACTOR VACUUM M CUP 4 TUBE (SUCTIONS) IMPLANT
GLOVE BIO SURGEON STRL SZ7.5 (GLOVE) ×2 IMPLANT
GLOVE BIOGEL PI IND STRL 7.0 (GLOVE) ×1 IMPLANT
GLOVE BIOGEL PI INDICATOR 7.0 (GLOVE) ×1
GOWN STRL REUS W/TWL LRG LVL3 (GOWN DISPOSABLE) ×4 IMPLANT
KIT ABG SYR 3ML LUER SLIP (SYRINGE) ×2 IMPLANT
NEEDLE HYPO 25X5/8 SAFETYGLIDE (NEEDLE) ×2 IMPLANT
NS IRRIG 1000ML POUR BTL (IV SOLUTION) ×2 IMPLANT
PACK C SECTION WH (CUSTOM PROCEDURE TRAY) ×2 IMPLANT
PAD OB MATERNITY 4.3X12.25 (PERSONAL CARE ITEMS) ×2 IMPLANT
PENCIL SMOKE EVAC W/HOLSTER (ELECTROSURGICAL) ×2 IMPLANT
STRIP CLOSURE SKIN 1/2X4 (GAUZE/BANDAGES/DRESSINGS) ×2 IMPLANT
SUT CHROMIC 2 0 SH (SUTURE) ×2 IMPLANT
SUT MNCRL 0 VIOLET CTX 36 (SUTURE) ×4 IMPLANT
SUT MONOCRYL 0 CTX 36 (SUTURE) ×4
SUT PDS AB 0 CTX 60 (SUTURE) ×2 IMPLANT
SUT PLAIN 0 NONE (SUTURE) IMPLANT
SUT PLAIN 2 0 (SUTURE)
SUT PLAIN 2 0 XLH (SUTURE) IMPLANT
SUT PLAIN ABS 2-0 CT1 27XMFL (SUTURE) IMPLANT
SUT VIC AB 4-0 KS 27 (SUTURE) ×2 IMPLANT
TOWEL OR 17X24 6PK STRL BLUE (TOWEL DISPOSABLE) ×2 IMPLANT
TRAY FOLEY W/BAG SLVR 14FR LF (SET/KITS/TRAYS/PACK) ×2 IMPLANT

## 2018-07-22 NOTE — Transfer of Care (Signed)
Immediate Anesthesia Transfer of Care Note  Patient: Julia Stephens  Procedure(s) Performed: CESAREAN SECTION (N/A )  Patient Location: PACU  Anesthesia Type:Epidural  Level of Consciousness: awake  Airway & Oxygen Therapy: Patient Spontanous Breathing  Post-op Assessment: Report given to RN  Post vital signs: Reviewed and stable  Last Vitals:  Vitals Value Taken Time  BP    Temp    Pulse    Resp    SpO2      Last Pain:  Vitals:   07/22/18 0714  TempSrc: Oral  PainSc: 0-No pain         Complications: No apparent anesthesia complications

## 2018-07-22 NOTE — Progress Notes (Signed)
FHT cat one UCs q2-3 Cx 2/60/-2 AROM well applied vertex>no fluid yet +bloody show

## 2018-07-22 NOTE — Progress Notes (Signed)
No changes to H&P per patient history FHT cat one UCs irregular Last Cytotec about 5am D/W patient two stage IOL

## 2018-07-22 NOTE — Brief Op Note (Signed)
07/22/2018  6:25 PM  PATIENT:  Versia Zollocoffer  27 y.o. female  PRE-OPERATIVE DIAGNOSIS:  stat c/s nonreassuring fetal heart rate  POST-OPERATIVE DIAGNOSIS:  stat c/s nonreassuring fetal heart rate  PROCEDURE:  Procedure(s): CESAREAN SECTION (N/A)  SURGEON:  Surgeon(s) and Role:    Everlene Farrier, MD - Primary  PHYSICIAN ASSISTANT:   ASSISTANTS: Maida Sale FNA   ANESTHESIA:   epidural  EBL:  884 mL   BLOOD ADMINISTERED:none  DRAINS: Urinary Catheter (Foley)   LOCAL MEDICATIONS USED:  NONE  SPECIMEN:  Source of Specimen:  placenta  DISPOSITION OF SPECIMEN:  PATHOLOGY  COUNTS:  YES  TOURNIQUET:  * No tourniquets in log *  DICTATION: .Other Dictation: Dictation Number [id number]  PLAN OF CARE: Admit to inpatient   PATIENT DISPOSITION:  PACU - hemodynamically stable.   Delay start of Pharmacological VTE agent (>24hrs) due to surgical blood loss or risk of bleeding: not applicable

## 2018-07-22 NOTE — Anesthesia Procedure Notes (Signed)
Epidural Patient location during procedure: OB Start time: 07/22/2018 2:50 PM End time: 07/22/2018 3:02 PM  Staffing Anesthesiologist: Lynda Rainwater, MD Performed: anesthesiologist   Preanesthetic Checklist Completed: patient identified, site marked, surgical consent, pre-op evaluation, timeout performed, IV checked, risks and benefits discussed and monitors and equipment checked  Epidural Patient position: sitting Prep: ChloraPrep Patient monitoring: heart rate, cardiac monitor, continuous pulse ox and blood pressure Approach: midline Location: L2-L3 Injection technique: LOR saline  Needle:  Needle type: Tuohy  Needle gauge: 17 G Needle length: 9 cm Needle insertion depth: 4 cm Catheter type: closed end flexible Catheter size: 20 Guage Catheter at skin depth: 8 cm Test dose: negative  Assessment Events: blood not aspirated, injection not painful, no injection resistance, negative IV test and no paresthesia  Additional Notes Reason for block:procedure for pain

## 2018-07-22 NOTE — Anesthesia Preprocedure Evaluation (Addendum)
Anesthesia Evaluation  Patient identified by MRN, date of birth, ID band Patient awake    Reviewed: Allergy & Precautions, NPO status , Patient's Chart, lab work & pertinent test results  Airway Mallampati: II  TM Distance: >3 FB Neck ROM: Full    Dental no notable dental hx.    Pulmonary neg pulmonary ROS,    Pulmonary exam normal breath sounds clear to auscultation       Cardiovascular negative cardio ROS Normal cardiovascular exam Rhythm:Regular Rate:Normal     Neuro/Psych negative neurological ROS  negative psych ROS   GI/Hepatic negative GI ROS, Neg liver ROS,   Endo/Other  negative endocrine ROS  Renal/GU negative Renal ROS  negative genitourinary   Musculoskeletal negative musculoskeletal ROS (+)   Abdominal   Peds negative pediatric ROS (+)  Hematology negative hematology ROS (+)   Anesthesia Other Findings   Reproductive/Obstetrics negative OB ROS (+) Pregnancy                             Anesthesia Physical Anesthesia Plan  ASA: II and emergent  Anesthesia Plan: Epidural   Post-op Pain Management:    Induction:   PONV Risk Score and Plan:   Airway Management Planned:   Additional Equipment:   Intra-op Plan:   Post-operative Plan:   Informed Consent:   Plan Discussed with:   Anesthesia Plan Comments: (Stat Csection. Dosed epidural for surgical anesthesia)      Anesthesia Quick Evaluation

## 2018-07-22 NOTE — Progress Notes (Signed)
FHT cat one, there were a few questionable decels-not repetitive, in last couple of hours UCs irregular Cx cl/th/mod soft/-3/vtx Continue Cytotec

## 2018-07-22 NOTE — Anesthesia Postprocedure Evaluation (Signed)
Anesthesia Post Note  Patient: Julia Stephens  Procedure(s) Performed: CESAREAN SECTION (N/A )     Patient location during evaluation: PACU Anesthesia Type: Epidural Level of consciousness: awake, awake and alert and oriented Pain management: pain level controlled Vital Signs Assessment: post-procedure vital signs reviewed and stable Respiratory status: spontaneous breathing, nonlabored ventilation and respiratory function stable Cardiovascular status: stable Postop Assessment: no headache, no backache, epidural receding, patient able to bend at knees and no signs of nausea or vomiting Anesthetic complications: no    Last Vitals:  Vitals:   07/22/18 2130 07/22/18 2245  BP: (!) 111/56 (!) 111/55  Pulse: 70 87  Resp: 16 20  Temp: 37.1 C 36.8 C  SpO2: 98% 98%    Last Pain:  Vitals:   07/22/18 2245  TempSrc: Oral  PainSc: 2    Pain Goal:                 Catalina Gravel

## 2018-07-22 NOTE — Anesthesia Pain Management Evaluation Note (Signed)
  CRNA Pain Management Visit Note  Patient: Julia Stephens, 27 y.o., female  "Hello I am a member of the anesthesia team at [facility]. We have an anesthesia team available at all times to provide care throughout the hospital, including epidural management and anesthesia for C-section. I don't know your plan for the delivery whether it a natural birth, water birth, IV sedation, nitrous supplementation, doula or epidural, but we want to meet your pain goals."   1.Was your pain managed to your expectations on prior hospitalizations?   Yes   2.What is your expectation for pain management during this hospitalization?     Epidural  3.How can we help you reach that goal? epidural  Record the patient's initial score and the patient's pain goal.   Pain: am induction  Pain Goal: less than 3 [facility] wants you to be able to say your pain was always managed very well.  Ailene Ards 07/22/2018

## 2018-07-22 NOTE — Consult Note (Signed)
[facility]  Delivery Note:  C-section       07/22/2018  5:51 PM  I was called to the operating room at the request of the patient's obstetrician (Dr. Gaetano Net) for a STAT primary c-section.  PRENATAL HX:  This is a 27 y/o G1P0 at 65 and 0/[redacted] weeks gestation who was admitted for IOL due to post dates.  Pregnancy was uncomplicated.  During active labor the fetus had a prolonged deceleration to 80s-90s shortly after AROM that did not return to baseline, so delivery was by stat c-section.    DELIVERY:  Infant was vigorous at delivery, requiring no resuscitation other than standard warming, drying and stimulation.  APGARs 8 and 9.  Exam within normal limits.  After 5 minutes, baby left with nurse to assist parents with skin-to-skin care.   _____________________ Electronically Signed By: Clinton Gallant, MD Neonatologist

## 2018-07-23 ENCOUNTER — Encounter (HOSPITAL_COMMUNITY): Payer: Self-pay

## 2018-07-23 LAB — CBC
HEMATOCRIT: 33.1 % — AB (ref 36.0–46.0)
HEMOGLOBIN: 11.4 g/dL — AB (ref 12.0–15.0)
MCH: 31.9 pg (ref 26.0–34.0)
MCHC: 34.4 g/dL (ref 30.0–36.0)
MCV: 92.7 fL (ref 78.0–100.0)
Platelets: 116 10*3/uL — ABNORMAL LOW (ref 150–400)
RBC: 3.57 MIL/uL — AB (ref 3.87–5.11)
RDW: 13.5 % (ref 11.5–15.5)
WBC: 14 10*3/uL — ABNORMAL HIGH (ref 4.0–10.5)

## 2018-07-23 MED ORDER — RHO D IMMUNE GLOBULIN 1500 UNIT/2ML IJ SOSY
300.0000 ug | PREFILLED_SYRINGE | Freq: Once | INTRAMUSCULAR | Status: AC
  Administered 2018-07-23: 300 ug via INTRAVENOUS
  Filled 2018-07-23: qty 2

## 2018-07-23 NOTE — Anesthesia Postprocedure Evaluation (Signed)
Anesthesia Post Note  Patient: Julia Stephens  Procedure(s) Performed: CESAREAN SECTION (N/A )     Patient location during evaluation: Mother Baby Anesthesia Type: Epidural Level of consciousness: awake and alert Pain management: pain level controlled Vital Signs Assessment: post-procedure vital signs reviewed and stable Respiratory status: spontaneous breathing, nonlabored ventilation and respiratory function stable Cardiovascular status: stable Postop Assessment: no headache, no backache and epidural receding Anesthetic complications: no    Last Vitals:  Vitals:   07/23/18 0553 07/23/18 0555  BP: (!) 103/57 (!) 104/52  Pulse:    Resp: 20 20  Temp:    SpO2: 97% 98%    Last Pain:  Vitals:   07/23/18 0540  TempSrc: Oral  PainSc:    Pain Goal:                 Julia Stephens

## 2018-07-23 NOTE — Lactation Note (Signed)
This note was copied from a baby's chart. Lactation Consultation Note  Patient Name: Julia Stephens Today's Date: 07/23/2018 Reason for consult: Initial assessment;Primapara;Term;1st time breastfeeding Breastfeeding consultation services and support information given and reviewed with patient.  Baby has been feeding frequently and well per mom.  Instructed to feed with feeding cues and call for assist prn.  Mom states she has received assist from the nurses which has been helpful.  Cautioned about pacifier use.  Maternal Data Has patient been taught Hand Expression?: Yes Does the patient have breastfeeding experience prior to this delivery?: No  Feeding Feeding Type: Breast Fed Length of feed: 10 min  LATCH Score Latch: Repeated attempts needed to sustain latch, nipple held in mouth throughout feeding, stimulation needed to elicit sucking reflex.  Audible Swallowing: A few with stimulation  Type of Nipple: Everted at rest and after stimulation  Comfort (Breast/Nipple): Soft / non-tender  Hold (Positioning): Assistance needed to correctly position infant at breast and maintain latch.  LATCH Score: 7  Interventions    Lactation Tools Discussed/Used     Consult Status Consult Status: Follow-up Date: 07/24/18 Follow-up type: In-patient    Ave Filter 07/23/2018, 10:30 AM

## 2018-07-23 NOTE — Addendum Note (Signed)
Addendum  created 07/23/18 0739 by Riki Sheer, CRNA   Sign clinical note

## 2018-07-23 NOTE — Progress Notes (Signed)
Subjective: Postpartum Day 1: Cesarean Delivery Patient reports nausea, incisional pain and tolerating PO.    Objective: Vital signs in last 24 hours: Temp:  [97.5 F (36.4 C)-98.7 F (37.1 C)] 98.2 F (36.8 C) (08/20 0540) Pulse Rate:  [70-105] 71 (08/20 0540) Resp:  [14-24] 20 (08/20 0555) BP: (83-126)/(37-95) 104/52 (08/20 0555) SpO2:  [97 %-100 %] 98 % (08/20 0555)  Physical Exam:  General: alert and cooperative Lochia: appropriate Uterine Fundus: firm Incision: healing well, no significant drainage DVT Evaluation: No evidence of DVT seen on physical exam.  Recent Labs    07/22/18 1432 07/23/18 0540  HGB 13.4 11.4*  HCT 38.6 33.1*    Assessment/Plan: Status post Cesarean section. Doing well postoperatively.  Continue current care.  Julia Stephens 07/23/2018, 8:50 AM

## 2018-07-23 NOTE — Op Note (Signed)
NAME: Julia Stephens, Julia Stephens MEDICAL RECORD [id number] ACCOUNT [id number] DATE OF BIRTH:08-Jun-1991 FACILITY: [facility] LOCATION: [id number] PHYSICIAN:Suman Trivedi E. Niyonna Betsill II, MD  OPERATIVE REPORT  DATE OF PROCEDURE:  07/22/2018  PREOPERATIVE DIAGNOSIS:  Nonreassuring fetal heart tracing.  POSTOPERATIVE DIAGNOSIS:  Nonreassuring fetal heart tracing.  PROCEDURE:  Emergent low transverse cesarean section.  SURGEON:  Everlene Farrier M.D.  ANESTHESIA:  Epidural.  ESTIMATED BLOOD LOSS:  884 mL  FINDINGS:  Viable female infant, Apgars of 8 and 9 at 1 and 5 minutes respectively.  Birthweight and arterial cord pH pending.  DRAINS:  Foley catheter to straight drain.  INDICATIONS AND CONSENT:  This patient is a 27 year old G1, P0 admitted at 41-0/7 weeks for postdates induction of labor.  She progressed to 5-6 cm dilation, complete effacement, -2 station.  The baby's heart rate decelerations to the 80s-90s.  Position  change, oxygen, IV fluids have been administered.  Blood pressure is stable.  Repeat pelvic exam reveals the cervix to be unchanged.  Recommendation for emergent cesarean section was made.  The potential risks and complications are quickly discussed  including infection, organ damage, and bleeding.  The patient states she agrees and understands.  DESCRIPTION OF PROCEDURE:  The patient was taken emergently to the operating room.  Her epidural was augmented to a surgical level and she underwent a rapid abdominal prep.  Foley catheter was already in place.  She was draped in a sterile fashion.   Timeout was undertaken.  After testing for adequate epidural anesthesia, skin was entered through a Pfannenstiel incision and dissection was carried out in layers to the peritoneum.  The peritoneum was sharply entered and extended bluntly.  Vesicouterine  peritoneum was taken down cephalad laterally.  Bladder flap developed.  Uterus was incised in a low transverse manner and the uterine cavity was entered  bluntly with a hemostat.  The uterine incision was extended with the fingers.  Vertex was then  delivered and the oronasopharynx were suctioned.  Baby was delivered in good cry and tone is noted.  After 1 minute time, the cord was clamped and cut and the baby was handed to awaiting pediatrics team.  Placenta was manually delivered and sent to  pathology.  There were no obvious defects.  Uterus was delivered for closure.  Uterine cavity is clean.  Uterus was then closed in 2 running locking imbricating layers of 0 Monocryl suture.  Tubes and ovaries were normal.  Uterus was returned to the  abdomen.  Lavage was carried out.  Bleeders at the incision were controlled with interrupted 2-0 chromic suture.  Good hemostasis was then noted.  Anterior peritoneum was closed in a running fashion with 0 Monocryl suture, which was also used to  reapproximate the pyramidalis muscle in the midline.  Anterior rectus fascia was closed in a running fashion with 0 looped PDS.  Subcutaneous layer was closed with interrupted plain and the skin was closed in a subcuticular fashion with 4-0 Vicryl on a  Keith needle.  Steri-Strips and dressing were applied.  All counts were correct.  The patient was transferred to recovery room in stable condition.  TN/NUANCE  D:07/22/2018 T:07/23/2018 JOB:[id number]

## 2018-07-24 LAB — RH IG WORKUP (INCLUDES ABO/RH)
ABO/RH(D): AB NEG
Fetal Screen: NEGATIVE
GESTATIONAL AGE(WKS): 41
Unit division: 0

## 2018-07-24 MED ORDER — IBUPROFEN 600 MG PO TABS: 600.0000 mg | ORAL_TABLET | Freq: Four times a day (QID) | ORAL | 0 refills | Status: DC

## 2018-07-24 NOTE — Discharge Summary (Signed)
Obstetric Discharge Summary Reason for Admission: induction of labor Prenatal Procedures: none Intrapartum Procedures: cesarean: low cervical, transverse Postpartum Procedures: none Complications-Operative and Postpartum: none Hemoglobin  Date Value Ref Range Status  07/23/2018 11.4 (L) 12.0 - 15.0 g/dL Final   HCT  Date Value Ref Range Status  07/23/2018 33.1 (L) 36.0 - 46.0 % Final    Physical Exam:  General: alert Lochia: appropriate Uterine Fundus: firm Incision: healing well DVT Evaluation: No evidence of DVT seen on physical exam.  Discharge Diagnoses: Term Pregnancy-delivered  Discharge Information: Date: 07/24/2018 Activity: pelvic rest Diet: routine Medications: PNV and Ibuprofen Condition: stable Instructions: refer to practice specific booklet Discharge to: home Follow-up [place] Schedule an appointment as soon as possible for a visit in 1 week(s).   Contact information: [address] [place] [phone]           Newborn Data: Live born female  Birth Weight: 7 lb 6.7 oz (3365 g) APGAR: 71, 9  Newborn Delivery   Birth date/time:  07/22/2018 17:56:00 Delivery type:  C-Section, Low Transverse Trial of labor:  No C-section categorization:  Primary     Home with mother.  [place] 07/24/2018, 8:54 AM

## 2018-08-19 DIAGNOSIS — Z3482 Encounter for supervision of other normal pregnancy, second trimester: Secondary | ICD-10-CM | POA: Diagnosis not present

## 2018-08-19 DIAGNOSIS — Z3483 Encounter for supervision of other normal pregnancy, third trimester: Secondary | ICD-10-CM | POA: Diagnosis not present

## 2018-09-12 ENCOUNTER — Inpatient Hospital Stay (HOSPITAL_COMMUNITY): Admission: AD | Admit: 2018-09-12 | Payer: Self-pay | Source: Ambulatory Visit | Admitting: Obstetrics & Gynecology

## 2018-10-03 DIAGNOSIS — Z3482 Encounter for supervision of other normal pregnancy, second trimester: Secondary | ICD-10-CM | POA: Diagnosis not present

## 2018-10-03 DIAGNOSIS — Z3483 Encounter for supervision of other normal pregnancy, third trimester: Secondary | ICD-10-CM | POA: Diagnosis not present

## 2020-02-13 DIAGNOSIS — Z01419 Encounter for gynecological examination (general) (routine) without abnormal findings: Secondary | ICD-10-CM | POA: Diagnosis not present

## 2020-02-13 DIAGNOSIS — R81 Glycosuria: Secondary | ICD-10-CM | POA: Diagnosis not present

## 2020-02-13 DIAGNOSIS — Z6830 Body mass index (BMI) 30.0-30.9, adult: Secondary | ICD-10-CM | POA: Diagnosis not present

## 2020-02-13 DIAGNOSIS — Z3040 Encounter for surveillance of contraceptives, unspecified: Secondary | ICD-10-CM | POA: Diagnosis not present

## 2020-03-24 DIAGNOSIS — Z20828 Contact with and (suspected) exposure to other viral communicable diseases: Secondary | ICD-10-CM | POA: Diagnosis not present

## 2020-03-24 DIAGNOSIS — Z20822 Contact with and (suspected) exposure to covid-19: Secondary | ICD-10-CM | POA: Diagnosis not present

## 2021-02-17 DIAGNOSIS — Z6829 Body mass index (BMI) 29.0-29.9, adult: Secondary | ICD-10-CM | POA: Diagnosis not present

## 2021-02-17 DIAGNOSIS — R87619 Unspecified abnormal cytological findings in specimens from cervix uteri: Secondary | ICD-10-CM | POA: Insufficient documentation

## 2021-02-17 DIAGNOSIS — Z01419 Encounter for gynecological examination (general) (routine) without abnormal findings: Secondary | ICD-10-CM | POA: Diagnosis not present

## 2021-03-11 DIAGNOSIS — F411 Generalized anxiety disorder: Secondary | ICD-10-CM | POA: Diagnosis not present

## 2021-03-18 DIAGNOSIS — F411 Generalized anxiety disorder: Secondary | ICD-10-CM | POA: Diagnosis not present

## 2021-03-25 DIAGNOSIS — F411 Generalized anxiety disorder: Secondary | ICD-10-CM | POA: Diagnosis not present

## 2021-04-05 DIAGNOSIS — F411 Generalized anxiety disorder: Secondary | ICD-10-CM | POA: Diagnosis not present

## 2021-04-12 DIAGNOSIS — F411 Generalized anxiety disorder: Secondary | ICD-10-CM | POA: Diagnosis not present

## 2021-04-19 DIAGNOSIS — F411 Generalized anxiety disorder: Secondary | ICD-10-CM | POA: Diagnosis not present

## 2021-04-29 ENCOUNTER — Other Ambulatory Visit: Payer: Self-pay

## 2021-04-29 ENCOUNTER — Encounter: Payer: Self-pay | Admitting: Family

## 2021-04-29 ENCOUNTER — Telehealth (INDEPENDENT_AMBULATORY_CARE_PROVIDER_SITE_OTHER): Payer: BC Managed Care – PPO | Admitting: Family

## 2021-04-29 DIAGNOSIS — Z7689 Persons encountering health services in other specified circumstances: Secondary | ICD-10-CM | POA: Diagnosis not present

## 2021-04-29 NOTE — Progress Notes (Signed)
Virtual Visit via Telephone Note  I connected with Julia Stephens, on 0/34/7425 at 1:58 PM by telephone due to the COVID-19 pandemic and verified that I am speaking with the correct person using two identifiers.  Due to current restrictions/limitations of in-office visits due to the COVID-19 pandemic, this scheduled clinical appointment was converted to a telehealth visit.   Consent: I discussed the limitations, risks, security and privacy concerns of performing an evaluation and management service by telephone and the availability of in person appointments. I also discussed with the patient that there may be a patient responsible charge related to this service. The patient expressed understanding and agreed to proceed.   Location of Patient: Home  Location of Provider: [facility] participating in Telemedicine visit: Cornie Lex Cantrell [place] Minette Brine, NP Elmon Else, CMA  History of Present Illness: Julia Stephens is a 30 year-old female who presents to establish care. PMH significant for dysmenorrhea, hematuria, low grade squamous intraepithelial dysplasia, and low TSH level.  Current issues and/or concerns: None.  Past Medical History:  Diagnosis Date  . Dysmenorrhea   . Vaginal Pap smear, abnormal    Allergies  Allergen Reactions  . Other Itching    Allergic to cats    Current Outpatient Medications on File Prior to Visit  Medication Sig Dispense Refill  . ibuprofen (ADVIL,MOTRIN) 600 MG tablet Take 1 tablet (600 mg total) by mouth every 6 (six) hours. 30 tablet 0  . Prenatal Vit-Fe Fumarate-FA (PRENATAL MULTIVITAMIN) TABS tablet Take 1 tablet by mouth daily at 12 noon.     No current facility-administered medications on file prior to visit.    Observations/Objective: Alert and oriented x 3. Not in acute distress. Physical examination not completed as this is a telemedicine visit.  Assessment and Plan: 1. Encounter  to establish care: - Patient presents today to establish care.  - Return for annual physical examination, labs, and health maintenance. Arrive fasting meaning having no food for at least 8 hours prior to appointment. You may have only water or black coffee. Please take scheduled medications as normal.   Follow Up Instructions: Return for annual physical exam.   Patient was given clear instructions to go to Emergency Department or return to medical center if symptoms don't improve, worsen, or new problems develop.The patient verbalized understanding.  I discussed the assessment and treatment plan with the patient. The patient was provided an opportunity to ask questions and all were answered. The patient agreed with the plan and demonstrated an understanding of the instructions.   The patient was advised to call back or seek an in-person evaluation if the symptoms worsen or if the condition fails to improve as anticipated.   I provided 5 minutes total of non-face-to-face time during this encounter.   Camillia Herter, NP  [facility] [place], [place] 04/29/2021, 1:58 PM

## 2021-04-29 NOTE — Progress Notes (Signed)
Establish care

## 2021-05-03 DIAGNOSIS — F411 Generalized anxiety disorder: Secondary | ICD-10-CM | POA: Diagnosis not present

## 2021-05-17 DIAGNOSIS — F411 Generalized anxiety disorder: Secondary | ICD-10-CM | POA: Diagnosis not present

## 2021-05-23 NOTE — Progress Notes (Signed)
Patient ID: Julia Stephens, female    DOB: [birth year]  MRN: [id number]  CC: Annual Physical Exam   Subjective: Julia Stephens is a 30 y.o. female who presents for annual physical exam.   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 Also, concerns for parenting, she has a 51 year-old daughter. She began seeing a therapist in April 2022. She is currently on an every other week schedule. Did experience chest pain recently related to anxiety. She is interested in an as needed medication as of present.   Depression screen [facility] 2/9 05/24/2021 04/29/2021  Decreased Interest 2 0  Down, Depressed, Hopeless 2 0  PHQ - 2 Score 4 0  Altered sleeping 1 0  Tired, decreased energy 2 0  Change in appetite 1 0  Feeling bad or failure about yourself  1 0  Trouble concentrating 3 0  Moving slowly or fidgety/restless 1 0  Suicidal thoughts - 0  PHQ-9 Score 13 0  Difficult doing work/chores Somewhat difficult Not difficult at all     Patient Active Problem List   Diagnosis Date Noted   Anxiety and depression 05/24/2021   Abnormal Pap smear of cervix 02/17/2021   Post-dates pregnancy 07/22/2018   Routine health maintenance 06/09/2014   Low TSH level 02/20/2013   History of shingles 01/08/2013   Low grade squamous intraepithelial dysplasia 12/18/2011   Family history of polycystic kidney disease 11/14/2011   Hematuria 11/14/2011   Dysmenorrhea      Current Outpatient Medications on File Prior to Visit  Medication Sig Dispense Refill   desogestrel-ethinyl estradiol (APRI) 0.15-30 MG-MCG tablet Apri 0.15 mg-0.03 mg tablet  TAKE 1 TABLET BY MOUTH EVERY DAY     No current facility-administered medications on file prior to visit.    Allergies  Allergen Reactions   Other Itching    Allergic to cats Allergic to cats Allergic to cats    Social History   Socioeconomic History   Marital status: Married     Spouse name: Not on file   Number of children: Not on file   Years of education: Not on file   Highest education level: Not on file  Occupational History   Not on file  Tobacco Use   Smoking status: Never   Smokeless tobacco: Never  Vaping Use   Vaping Use: Never used  Substance and Sexual Activity   Alcohol use: Yes   Drug use: No   Sexual activity: Yes    Birth control/protection: Condom, Pill  Other Topics Concern   Not on file  Social History Narrative   Not on file   Social Determinants of Health   Financial Resource Strain: Not on file  Food Insecurity: Not on file  Transportation Needs: Not on file  Physical Activity: Not on file  Stress: Not on file  Social Connections: Not on file  Intimate Partner Violence: Not on file    Family History  Problem Relation Age of Onset   Polycystic kidney disease Maternal Grandmother    Diabetes Maternal Grandmother    Hypertension Maternal Grandmother    Vision loss Maternal Grandmother    Diabetes Maternal Grandfather    Hypertension Maternal Grandfather    Vision loss Maternal Grandfather    Hyperlipidemia Father    Hypertension Father    Hyperlipidemia Brother    Hypertension Brother    Diabetes Paternal Grandfather    Hypertension Paternal Merchant navy officer  Hypertension Paternal Grandmother     Past Surgical History:  Procedure Laterality Date   CESAREAN SECTION N/A 07/22/2018   Procedure: CESAREAN SECTION;  Surgeon: Everlene Farrier, MD;  Location: [place];  Service: Obstetrics;  Laterality: N/A;   COLPOSCOPY  01/09/2012   Colpo LGSILw/HPV   WISDOM TOOTH EXTRACTION      ROS: Review of Systems Negative except as stated above  PHYSICAL EXAM: BP 126/86 (BP Location: Left Arm, Patient Position: Sitting, Cuff Size: Normal)   Pulse 62   Temp 98.4 F (36.9 C)   Resp 15   Ht 5' 8.47" (1.739 m)   Wt 185 lb 9.6 oz (84.2 kg)   SpO2 98%   BMI 27.84 kg/m   Physical Exam General appearance - alert,  well appearing, and in no distress and oriented to person, place, and time Mental status - alert, oriented to person, place, and time, normal mood, behavior, speech, dress, motor activity, and thought processes Eyes - pupils equal and reactive, extraocular eye movements intact Ears - bilateral TM's and external ear canals normal Neck - supple, no significant adenopathy Lymphatics - no palpable lymphadenopathy, no hepatosplenomegaly Chest - clear to auscultation, no wheezes, rales or rhonchi, symmetric air entry, no tachypnea, retractions or cyanosis Heart - normal rate, regular rhythm, normal S1, S2, no murmurs, rubs, clicks or gallops Abdomen - soft, nontender, nondistended, no masses or organomegaly Breasts - patient declines to have breast exam Pelvic - exam declined by the patient Neurological - alert, oriented, normal speech, no focal findings or movement disorder noted, neck supple without rigidity, cranial nerves II through XII intact, funduscopic exam normal, discs flat and sharp, DTR's normal and symmetric, motor and sensory grossly normal bilaterally, normal muscle tone, no tremors, strength 5/5 Musculoskeletal - no joint tenderness, deformity or swelling Extremities - peripheral pulses normal, no pedal edema, no clubbing or cyanosis Skin - normal coloration and turgor, no rashes, no suspicious skin lesions noted  ASSESSMENT AND PLAN: 1. Annual physical exam: - Counseled on 150 minutes of exercise per week as tolerated, healthy eating (including decreased daily intake of saturated fats, cholesterol, added sugars, sodium), STI prevention, and routine healthcare maintenance.  2. Screening for metabolic disorder: - CMP to check kidney function, liver function, and electrolyte balance.  - CMP14+EGFR  3. Screening for deficiency anemia: - CBC to screen for anemia. - CBC  4. Diabetes mellitus screening: - Hemoglobin A1c to screen for pre-diabetes/diabetes. - Hemoglobin A1c  5.  Screening cholesterol level: - Lipid panel to screen for high cholesterol.  - Lipid panel  6. Thyroid disorder screen: - TSH to check thyroid function.  - TSH  7. Need for hepatitis C screening test: - Hepatitis C antibody to screen for hepatitis C.  - Hepatitis C antibody  8. Cervical cancer screening: - Patient reports PAP smear last completed 02/17/2021.  9. Anxiety and depression: - Newly diagnosed.  - Patient denies thoughts of self-harm, suicidal ideations, and homicidal ideations.  - Begin Hydroxyzine as prescribed. May cause drowsiness. Counseled patient to not consume if operating heavy machinery or driving. Counseled patient to not consume with alcohol or illicit substances. Patient verbalized understanding.  - Follow-up with primary provider in 4 weeks or sooner if needed.  - hydrOXYzine (VISTARIL) 25 MG capsule; Take 1 capsule (25 mg total) by mouth every 8 (eight) hours as needed.  Dispense: 30 capsule; Refill: 1   Patient was given the opportunity to ask questions.  Patient verbalized understanding of the plan and  was able to repeat key elements of the plan. Patient was given clear instructions to go to Emergency Department or return to medical center if symptoms don't improve, worsen, or new problems develop.The patient verbalized understanding.   Orders Placed This Encounter  Procedures   CMP14+EGFR   Hemoglobin A1c   CBC   Lipid panel   TSH   Hepatitis C antibody     Requested Prescriptions   Signed Prescriptions Disp Refills   hydrOXYzine (VISTARIL) 25 MG capsule 30 capsule 1    Sig: Take 1 capsule (25 mg total) by mouth every 8 (eight) hours as needed.    Return in about 4 weeks (around 06/21/2021) for Follow-Up anxiety and depression .  Camillia Herter, NP

## 2021-05-24 ENCOUNTER — Encounter: Payer: Self-pay | Admitting: Family

## 2021-05-24 ENCOUNTER — Other Ambulatory Visit: Payer: Self-pay

## 2021-05-24 ENCOUNTER — Ambulatory Visit (INDEPENDENT_AMBULATORY_CARE_PROVIDER_SITE_OTHER): Payer: BC Managed Care – PPO | Admitting: Family

## 2021-05-24 VITALS — BP 126/86 | HR 62 | Temp 98.4°F | Resp 15 | Ht 68.47 in | Wt 185.6 lb

## 2021-05-24 DIAGNOSIS — Z130 Encounter for screening for diseases of the blood and blood-forming organs and certain disorders involving the immune mechanism: Secondary | ICD-10-CM | POA: Diagnosis not present

## 2021-05-24 DIAGNOSIS — F32A Depression, unspecified: Secondary | ICD-10-CM | POA: Insufficient documentation

## 2021-05-24 DIAGNOSIS — Z0000 Encounter for general adult medical examination without abnormal findings: Secondary | ICD-10-CM | POA: Diagnosis not present

## 2021-05-24 DIAGNOSIS — Z1159 Encounter for screening for other viral diseases: Secondary | ICD-10-CM | POA: Diagnosis not present

## 2021-05-24 DIAGNOSIS — Z13228 Encounter for screening for other metabolic disorders: Secondary | ICD-10-CM | POA: Diagnosis not present

## 2021-05-24 DIAGNOSIS — F419 Anxiety disorder, unspecified: Secondary | ICD-10-CM

## 2021-05-24 DIAGNOSIS — Z13220 Encounter for screening for lipoid disorders: Secondary | ICD-10-CM

## 2021-05-24 DIAGNOSIS — Z1329 Encounter for screening for other suspected endocrine disorder: Secondary | ICD-10-CM

## 2021-05-24 DIAGNOSIS — Z124 Encounter for screening for malignant neoplasm of cervix: Secondary | ICD-10-CM

## 2021-05-24 DIAGNOSIS — Z131 Encounter for screening for diabetes mellitus: Secondary | ICD-10-CM | POA: Diagnosis not present

## 2021-05-24 MED ORDER — HYDROXYZINE PAMOATE 25 MG PO CAPS: 25.0000 mg | ORAL_CAPSULE | Freq: Three times a day (TID) | ORAL | 1 refills | Status: DC | PRN

## 2021-05-24 NOTE — Progress Notes (Signed)
Pt presents for annual physical exam Pap completed 02/17/2021.

## 2021-05-24 NOTE — Patient Instructions (Signed)
Preventive Care 21-29 Years Old, Female Preventive care refers to lifestyle choices and visits with your health care provider that can promote health and wellness. This includes: A yearly physical exam. This is also called an annual wellness visit. Regular dental and eye exams. Immunizations. Screening for certain conditions. Healthy lifestyle choices, such as: Eating a healthy diet. Getting regular exercise. Not using drugs or products that contain nicotine and tobacco. Limiting alcohol use. What can I expect for my preventive care visit? Physical exam Your health care provider may check your: Height and weight. These may be used to calculate your BMI (body mass index). BMI is a measurement that tells if you are at a healthy weight. Heart rate and blood pressure. Body temperature. Skin for abnormal spots. Counseling Your health care provider may ask you questions about your: Past medical problems. Family's medical history. Alcohol, tobacco, and drug use. Emotional well-being. Home life and relationship well-being. Sexual activity. Diet, exercise, and sleep habits. Work and work environment. Access to firearms. Method of birth control. Menstrual cycle. Pregnancy history. What immunizations do I need?  Vaccines are usually given at various ages, according to a schedule. Your health care provider will recommend vaccines for you based on your age, medicalhistory, and lifestyle or other factors, such as travel or where you work. What tests do I need?  Blood tests Lipid and cholesterol levels. These may be checked every 5 years starting at age 20. Hepatitis C test. Hepatitis B test. Screening Diabetes screening. This is done by checking your blood sugar (glucose) after you have not eaten for a while (fasting). STD (sexually transmitted disease) testing, if you are at risk. BRCA-related cancer screening. This may be done if you have a family history of breast, ovarian, tubal, or  peritoneal cancers. Pelvic exam and Pap test. This may be done every 3 years starting at age 21. Starting at age 30, this may be done every 5 years if you have a Pap test in combination with an HPV test. Talk with your health care provider about your test results, treatment options,and if necessary, the need for more tests. Follow these instructions at home: Eating and drinking  Eat a healthy diet that includes fresh fruits and vegetables, whole grains, lean protein, and low-fat dairy products. Take vitamin and mineral supplements as recommended by your health care provider. Do not drink alcohol if: Your health care provider tells you not to drink. You are pregnant, may be pregnant, or are planning to become pregnant. If you drink alcohol: Limit how much you have to 0-1 drink a day. Be aware of how much alcohol is in your drink. In the [place] one drink equals one 12 oz bottle of beer (355 mL), one 5 oz glass of wine (148 mL), or one 1 oz glass of hard liquor (44 mL).  Lifestyle Take daily care of your teeth and gums. Brush your teeth every morning and night with fluoride toothpaste. Floss one time each day. Stay active. Exercise for at least 30 minutes 5 or more days each week. Do not use any products that contain nicotine or tobacco, such as cigarettes, e-cigarettes, and chewing tobacco. If you need help quitting, ask your health care provider. Do not use drugs. If you are sexually active, practice safe sex. Use a condom or other form of protection to prevent STIs (sexually transmitted infections). If you do not wish to become pregnant, use a form of birth control. If you plan to become pregnant, see your health care   provider for a prepregnancy visit. Find healthy ways to cope with stress, such as: Meditation, yoga, or listening to music. Journaling. Talking to a trusted person. Spending time with friends and family. Safety Always wear your seat belt while driving or riding in a  vehicle. Do not drive: If you have been drinking alcohol. Do not ride with someone who has been drinking. When you are tired or distracted. While texting. Wear a helmet and other protective equipment during sports activities. If you have firearms in your house, make sure you follow all gun safety procedures. Seek help if you have been physically or sexually abused. What's next? Go to your health care provider once a year for an annual wellness visit. Ask your health care provider how often you should have your eyes and teeth checked. Stay up to date on all vaccines. This information is not intended to replace advice given to you by your health care provider. Make sure you discuss any questions you have with your healthcare provider. Document Revised: 07/18/2020 Document Reviewed: 08/01/2018 Elsevier Patient Education  2022 [employer].

## 2021-05-25 LAB — CBC
Hematocrit: 43.6 % (ref 34.0–46.6)
Hemoglobin: 14.9 g/dL (ref 11.1–15.9)
MCH: 30.8 pg (ref 26.6–33.0)
MCHC: 34.2 g/dL (ref 31.5–35.7)
MCV: 90 fL (ref 79–97)
Platelets: 188 10*3/uL (ref 150–450)
RBC: 4.83 x10E6/uL (ref 3.77–5.28)
RDW: 11.8 % (ref 11.7–15.4)
WBC: 5.8 10*3/uL (ref 3.4–10.8)

## 2021-05-25 LAB — CMP14+EGFR
ALT: 12 IU/L (ref 0–32)
AST: 13 IU/L (ref 0–40)
Albumin/Globulin Ratio: 2 (ref 1.2–2.2)
Albumin: 4.5 g/dL (ref 3.9–5.0)
Alkaline Phosphatase: 51 IU/L (ref 44–121)
BUN/Creatinine Ratio: 14 (ref 9–23)
BUN: 10 mg/dL (ref 6–20)
Bilirubin Total: 0.4 mg/dL (ref 0.0–1.2)
CO2: 19 mmol/L — ABNORMAL LOW (ref 20–29)
Calcium: 9.3 mg/dL (ref 8.7–10.2)
Chloride: 103 mmol/L (ref 96–106)
Creatinine, Ser: 0.74 mg/dL (ref 0.57–1.00)
Globulin, Total: 2.3 g/dL (ref 1.5–4.5)
Glucose: 62 mg/dL — ABNORMAL LOW (ref 65–99)
Potassium: 4.2 mmol/L (ref 3.5–5.2)
Sodium: 141 mmol/L (ref 134–144)
Total Protein: 6.8 g/dL (ref 6.0–8.5)
eGFR: 112 mL/min/{1.73_m2} (ref >59)

## 2021-05-25 LAB — HEMOGLOBIN A1C
Est. average glucose Bld gHb Est-mCnc: 100 mg/dL
Hgb A1c MFr Bld: 5.1 % (ref 4.8–5.6)

## 2021-05-25 LAB — LIPID PANEL
Chol/HDL Ratio: 3.2 ratio (ref 0.0–4.4)
Cholesterol, Total: 202 mg/dL — ABNORMAL HIGH (ref 100–199)
HDL: 64 mg/dL (ref >39)
LDL Chol Calc (NIH): 107 mg/dL — ABNORMAL HIGH (ref 0–99)
Triglycerides: 183 mg/dL — ABNORMAL HIGH (ref 0–149)
VLDL Cholesterol Cal: 31 mg/dL (ref 5–40)

## 2021-05-25 LAB — TSH: TSH: 0.898 u[IU]/mL (ref 0.450–4.500)

## 2021-05-25 LAB — HEPATITIS C ANTIBODY: Hep C Virus Ab: <0.1 s/co ratio (ref 0.0–0.9)

## 2021-05-25 NOTE — Progress Notes (Signed)
Kidney function normal.   Liver function normal.   Thyroid function normal.   No diabetes.   No anemia.   Hepatitis C negative.  Cholesterol higher than expected. High cholesterol may increase risk of heart attack and/or stroke. Consider eating more fruits, vegetables, and lean baked meats such as chicken or fish. Moderate intensity exercise at least 150 minutes as tolerated per week may help as well. No medication needed at the moment as patient is average risk. Encouraged to recheck in 3 to 6 months.

## 2021-05-31 DIAGNOSIS — F411 Generalized anxiety disorder: Secondary | ICD-10-CM | POA: Diagnosis not present

## 2021-06-14 DIAGNOSIS — F411 Generalized anxiety disorder: Secondary | ICD-10-CM | POA: Diagnosis not present

## 2022-04-22 ENCOUNTER — Inpatient Hospital Stay (HOSPITAL_COMMUNITY): Payer: BC Managed Care – PPO | Admitting: Anesthesiology

## 2022-04-22 ENCOUNTER — Inpatient Hospital Stay (HOSPITAL_COMMUNITY): Payer: BC Managed Care – PPO

## 2022-04-22 ENCOUNTER — Ambulatory Visit
Admission: EM | Admit: 2022-04-22 | Discharge: 2022-04-22 | Disposition: A | Discharge status: Home / Self Care | Payer: BC Managed Care – PPO | Attending: Internal Medicine | Admitting: Internal Medicine

## 2022-04-22 ENCOUNTER — Other Ambulatory Visit: Payer: Self-pay

## 2022-04-22 ENCOUNTER — Encounter (HOSPITAL_COMMUNITY): Payer: Self-pay | Admitting: Obstetrics and Gynecology

## 2022-04-22 ENCOUNTER — Encounter (HOSPITAL_COMMUNITY)
Admission: AD | Disposition: A | Discharge status: Home / Self Care | Payer: Self-pay | Source: Home / Self Care | Attending: Obstetrics and Gynecology

## 2022-04-22 ENCOUNTER — Inpatient Hospital Stay (HOSPITAL_COMMUNITY)
Admission: AD | Admit: 2022-04-22 | Discharge: 2022-04-22 | Disposition: A | Discharge status: Home / Self Care | Payer: BC Managed Care – PPO | Attending: Obstetrics and Gynecology | Admitting: Obstetrics and Gynecology

## 2022-04-22 DIAGNOSIS — R109 Unspecified abdominal pain: Secondary | ICD-10-CM | POA: Diagnosis not present

## 2022-04-22 DIAGNOSIS — O00102 Left tubal pregnancy without intrauterine pregnancy: Secondary | ICD-10-CM

## 2022-04-22 DIAGNOSIS — O26899 Other specified pregnancy related conditions, unspecified trimester: Secondary | ICD-10-CM

## 2022-04-22 DIAGNOSIS — O009 Unspecified ectopic pregnancy without intrauterine pregnancy: Secondary | ICD-10-CM

## 2022-04-22 DIAGNOSIS — Z3A01 Less than 8 weeks gestation of pregnancy: Secondary | ICD-10-CM | POA: Diagnosis present

## 2022-04-22 DIAGNOSIS — Z3201 Encounter for pregnancy test, result positive: Secondary | ICD-10-CM | POA: Diagnosis not present

## 2022-04-22 HISTORY — DX: Nausea with vomiting, unspecified: R11.2

## 2022-04-22 HISTORY — DX: Other specified postprocedural states: Z98.890

## 2022-04-22 HISTORY — PX: DIAGNOSTIC LAPAROSCOPY WITH REMOVAL OF ECTOPIC PREGNANCY: SHX6449

## 2022-04-22 LAB — CBC
HCT: 38.7 % (ref 36.0–46.0)
Hemoglobin: 13.3 g/dL (ref 12.0–15.0)
MCH: 31.4 pg (ref 26.0–34.0)
MCHC: 34.4 g/dL (ref 30.0–36.0)
MCV: 91.5 fL (ref 80.0–100.0)
Platelets: 197 10*3/uL (ref 150–400)
RBC: 4.23 MIL/uL (ref 3.87–5.11)
RDW: 11.9 % (ref 11.5–15.5)
WBC: 9.5 10*3/uL (ref 4.0–10.5)
nRBC: 0 % (ref 0.0–0.2)

## 2022-04-22 LAB — POCT URINALYSIS DIP (MANUAL ENTRY)
Bilirubin, UA: NEGATIVE
Glucose, UA: NEGATIVE mg/dL
Ketones, POC UA: NEGATIVE mg/dL
Leukocytes, UA: NEGATIVE
Nitrite, UA: NEGATIVE
Protein Ur, POC: NEGATIVE mg/dL
Spec Grav, UA: 1.01 (ref 1.010–1.025)
Urobilinogen, UA: 0.2 E.U./dL
pH, UA: 7 (ref 5.0–8.0)

## 2022-04-22 LAB — WET PREP, GENITAL
Sperm: NONE SEEN
Trich, Wet Prep: NONE SEEN
WBC, Wet Prep HPF POC: <10 (ref <10)
Yeast Wet Prep HPF POC: NONE SEEN

## 2022-04-22 LAB — TYPE AND SCREEN
ABO/RH(D): AB NEG
Antibody Screen: NEGATIVE

## 2022-04-22 LAB — URINALYSIS, ROUTINE W REFLEX MICROSCOPIC
Bilirubin Urine: NEGATIVE
Glucose, UA: NEGATIVE mg/dL
Ketones, ur: 5 mg/dL — AB
Leukocytes,Ua: NEGATIVE
Nitrite: NEGATIVE
Protein, ur: NEGATIVE mg/dL
RBC / HPF: >50 RBC/hpf — ABNORMAL HIGH (ref 0–5)
Specific Gravity, Urine: 1.014 (ref 1.005–1.030)
pH: 6 (ref 5.0–8.0)

## 2022-04-22 LAB — HCG, QUANTITATIVE, PREGNANCY: hCG, Beta Chain, Quant, S: 459 m[IU]/mL — ABNORMAL HIGH (ref <5)

## 2022-04-22 LAB — POCT URINE PREGNANCY: Preg Test, Ur: POSITIVE — AB

## 2022-04-22 SURGERY — LAPAROSCOPY, WITH ECTOPIC PREGNANCY SURGICAL TREATMENT
Anesthesia: General | Site: Abdomen

## 2022-04-22 MED ORDER — OXYCODONE HCL 5 MG/5ML PO SOLN: 5.0000 mg | Freq: Once | ORAL | Status: DC | PRN

## 2022-04-22 MED ORDER — ALBUMIN HUMAN 5 % IV SOLN: INTRAVENOUS | Status: DC | PRN

## 2022-04-22 MED ORDER — SODIUM CHLORIDE (PF) 0.9 % IJ SOLN
INTRAMUSCULAR | Status: DC | PRN
  Administered 2022-04-22: 10 mL

## 2022-04-22 MED ORDER — LIDOCAINE 2% (20 MG/ML) 5 ML SYRINGE
INTRAMUSCULAR | Status: DC | PRN
  Administered 2022-04-22: 60 mg via INTRAVENOUS

## 2022-04-22 MED ORDER — MIDAZOLAM HCL 2 MG/2ML IJ SOLN
INTRAMUSCULAR | Status: AC
Start: 2022-04-22 — End: ?
  Filled 2022-04-22: qty 2

## 2022-04-22 MED ORDER — ACETAMINOPHEN 500 MG PO TABS
1000.0000 mg | ORAL_TABLET | Freq: Once | ORAL | Status: AC
  Administered 2022-04-22: 1000 mg via ORAL
  Filled 2022-04-22: qty 2

## 2022-04-22 MED ORDER — BUPIVACAINE HCL (PF) 0.5 % IJ SOLN
INTRAMUSCULAR | Status: AC
  Filled 2022-04-22: qty 30

## 2022-04-22 MED ORDER — ONDANSETRON HCL 4 MG/2ML IJ SOLN
INTRAMUSCULAR | Status: AC
  Filled 2022-04-22: qty 2

## 2022-04-22 MED ORDER — FENTANYL CITRATE (PF) 250 MCG/5ML IJ SOLN
INTRAMUSCULAR | Status: AC
  Filled 2022-04-22: qty 5

## 2022-04-22 MED ORDER — SUCCINYLCHOLINE CHLORIDE 200 MG/10ML IV SOSY
PREFILLED_SYRINGE | INTRAVENOUS | Status: DC | PRN
  Administered 2022-04-22: 120 mg via INTRAVENOUS

## 2022-04-22 MED ORDER — RHO D IMMUNE GLOBULIN 1500 UNIT/2ML IJ SOSY
300.0000 ug | PREFILLED_SYRINGE | Freq: Once | INTRAMUSCULAR | Status: AC
  Administered 2022-04-22: 300 ug via INTRAMUSCULAR
  Filled 2022-04-22: qty 2

## 2022-04-22 MED ORDER — BUPIVACAINE HCL (PF) 0.5 % IJ SOLN
INTRAMUSCULAR | Status: DC | PRN
  Administered 2022-04-22: 10 mL

## 2022-04-22 MED ORDER — ROCURONIUM BROMIDE 10 MG/ML (PF) SYRINGE
PREFILLED_SYRINGE | INTRAVENOUS | Status: DC | PRN
  Administered 2022-04-22: 50 mg via INTRAVENOUS

## 2022-04-22 MED ORDER — ONDANSETRON HCL 4 MG/2ML IJ SOLN
INTRAMUSCULAR | Status: DC | PRN
  Administered 2022-04-22: 4 mg via INTRAVENOUS

## 2022-04-22 MED ORDER — DEXAMETHASONE SODIUM PHOSPHATE 10 MG/ML IJ SOLN
INTRAMUSCULAR | Status: DC | PRN
  Administered 2022-04-22: 10 mg via INTRAVENOUS

## 2022-04-22 MED ORDER — LIDOCAINE 2% (20 MG/ML) 5 ML SYRINGE
INTRAMUSCULAR | Status: AC
  Filled 2022-04-22: qty 5

## 2022-04-22 MED ORDER — LACTATED RINGERS IV SOLN: INTRAVENOUS | Status: DC

## 2022-04-22 MED ORDER — ORAL CARE MOUTH RINSE: 15.0000 mL | Freq: Once | OROMUCOSAL | Status: AC

## 2022-04-22 MED ORDER — AMISULPRIDE (ANTIEMETIC) 5 MG/2ML IV SOLN
10.0000 mg | Freq: Once | INTRAVENOUS | Status: AC
  Administered 2022-04-22: 10 mg via INTRAVENOUS

## 2022-04-22 MED ORDER — AMISULPRIDE (ANTIEMETIC) 5 MG/2ML IV SOLN
INTRAVENOUS | Status: AC
  Filled 2022-04-22: qty 4

## 2022-04-22 MED ORDER — OXYCODONE HCL 5 MG PO TABS: 5.0000 mg | ORAL_TABLET | Freq: Once | ORAL | Status: DC | PRN

## 2022-04-22 MED ORDER — DEXAMETHASONE SODIUM PHOSPHATE 10 MG/ML IJ SOLN
INTRAMUSCULAR | Status: AC
Start: 2022-04-22 — End: ?
  Filled 2022-04-22: qty 1

## 2022-04-22 MED ORDER — ONDANSETRON HCL 4 MG/2ML IJ SOLN
4.0000 mg | Freq: Once | INTRAMUSCULAR | Status: AC | PRN
  Administered 2022-04-22: 4 mg via INTRAVENOUS

## 2022-04-22 MED ORDER — LIDOCAINE HCL 1 % IJ SOLN
INTRAMUSCULAR | Status: AC
  Filled 2022-04-22: qty 20

## 2022-04-22 MED ORDER — POVIDONE-IODINE 10 % EX SWAB
2.0000 "application " | Freq: Once | CUTANEOUS | Status: AC
  Administered 2022-04-22: 2 via TOPICAL

## 2022-04-22 MED ORDER — MIDAZOLAM HCL 2 MG/2ML IJ SOLN
INTRAMUSCULAR | Status: DC | PRN
  Administered 2022-04-22: 2 mg via INTRAVENOUS

## 2022-04-22 MED ORDER — FENTANYL CITRATE (PF) 100 MCG/2ML IJ SOLN: 25.0000 ug | INTRAMUSCULAR | Status: DC | PRN

## 2022-04-22 MED ORDER — CHLORHEXIDINE GLUCONATE 0.12 % MT SOLN
OROMUCOSAL | Status: AC
  Administered 2022-04-22: 15 mL via OROMUCOSAL
  Filled 2022-04-22: qty 15

## 2022-04-22 MED ORDER — FENTANYL CITRATE (PF) 250 MCG/5ML IJ SOLN
INTRAMUSCULAR | Status: DC | PRN
  Administered 2022-04-22: 50 ug via INTRAVENOUS
  Administered 2022-04-22: 100 ug via INTRAVENOUS
  Administered 2022-04-22: 50 ug via INTRAVENOUS

## 2022-04-22 MED ORDER — SUGAMMADEX SODIUM 200 MG/2ML IV SOLN
INTRAVENOUS | Status: DC | PRN
  Administered 2022-04-22: 200 mg via INTRAVENOUS

## 2022-04-22 MED ORDER — SODIUM CHLORIDE 0.9 % IR SOLN
Status: DC | PRN
  Administered 2022-04-22: 3000 mL

## 2022-04-22 MED ORDER — PROPOFOL 10 MG/ML IV BOLUS
INTRAVENOUS | Status: AC
  Filled 2022-04-22: qty 20

## 2022-04-22 MED ORDER — PROPOFOL 10 MG/ML IV BOLUS
INTRAVENOUS | Status: DC | PRN
  Administered 2022-04-22: 200 mg via INTRAVENOUS

## 2022-04-22 MED ORDER — CEFAZOLIN SODIUM-DEXTROSE 2-4 GM/100ML-% IV SOLN
2.0000 g | INTRAVENOUS | Status: AC
  Administered 2022-04-22: 2 g via INTRAVENOUS
  Filled 2022-04-22: qty 100

## 2022-04-22 MED ORDER — CHLORHEXIDINE GLUCONATE 0.12 % MT SOLN: 15.0000 mL | Freq: Once | OROMUCOSAL | Status: AC

## 2022-04-22 MED ORDER — 0.9 % SODIUM CHLORIDE (POUR BTL) OPTIME
TOPICAL | Status: DC | PRN
  Administered 2022-04-22: 1000 mL

## 2022-04-22 MED ORDER — SOD CITRATE-CITRIC ACID 500-334 MG/5ML PO SOLN
30.0000 mL | ORAL | Status: AC
  Administered 2022-04-22: 30 mL via ORAL
  Filled 2022-04-22: qty 30

## 2022-04-22 MED ORDER — OXYCODONE HCL 5 MG PO TABS: 5.0000 mg | ORAL_TABLET | Freq: Four times a day (QID) | ORAL | 0 refills | Status: AC | PRN

## 2022-04-22 SURGICAL SUPPLY — 33 items
BARRIER ADHS 3X4 INTERCEED (GAUZE/BANDAGES/DRESSINGS) IMPLANT
CABLE HIGH FREQUENCY MONO STRZ (ELECTRODE) IMPLANT
CATH ROBINSON RED A/P 16FR (CATHETERS) ×2 IMPLANT
DERMABOND ADHESIVE PROPEN (GAUZE/BANDAGES/DRESSINGS) ×1
DERMABOND ADVANCED (GAUZE/BANDAGES/DRESSINGS)
DERMABOND ADVANCED .7 DNX12 (GAUZE/BANDAGES/DRESSINGS) ×1 IMPLANT
DERMABOND ADVANCED .7 DNX6 (GAUZE/BANDAGES/DRESSINGS) IMPLANT
GLOVE BIO SURGEON STRL SZ8 (GLOVE) ×4 IMPLANT
GLOVE BIOGEL PI IND STRL 7.0 (GLOVE) ×1 IMPLANT
GLOVE BIOGEL PI IND STRL 8 (GLOVE) ×1 IMPLANT
GLOVE BIOGEL PI INDICATOR 7.0 (GLOVE) ×2
GLOVE BIOGEL PI INDICATOR 8 (GLOVE) ×1
IRRIG SUCT STRYKERFLOW 2 WTIP (MISCELLANEOUS) ×2
IRRIGATION SUCT STRKRFLW 2 WTP (MISCELLANEOUS) IMPLANT
KIT TURNOVER KIT B (KITS) ×2 IMPLANT
NDL INSUFFLATION 14GA 120MM (NEEDLE) ×1 IMPLANT
NEEDLE INSUFFLATION 14GA 120MM (NEEDLE) ×2 IMPLANT
NS IRRIG 1000ML POUR BTL (IV SOLUTION) ×2 IMPLANT
PACK LAPAROSCOPY BASIN (CUSTOM PROCEDURE TRAY) ×2 IMPLANT
PACK TRENDGUARD 450 HYBRID PRO (MISCELLANEOUS) IMPLANT
POUCH SPECIMEN RETRIEVAL 10MM (ENDOMECHANICALS) ×1 IMPLANT
PROTECTOR NERVE ULNAR (MISCELLANEOUS) ×4 IMPLANT
SEALER TISSUE G2 CVD JAW 45CM (ENDOMECHANICALS) ×1 IMPLANT
SET TUBE SMOKE EVAC HIGH FLOW (TUBING) ×2 IMPLANT
SLEEVE ENDOPATH XCEL 5M (ENDOMECHANICALS) IMPLANT
SUT VICRYL 0 UR6 27IN ABS (SUTURE) ×2 IMPLANT
SUT VICRYL 4-0 PS2 18IN ABS (SUTURE) ×1 IMPLANT
SYR 30ML LL (SYRINGE) ×1 IMPLANT
TOWEL GREEN STERILE FF (TOWEL DISPOSABLE) ×3 IMPLANT
TRENDGUARD 450 HYBRID PRO PACK (MISCELLANEOUS) ×2
TROCAR XCEL NON-BLD 11X100MML (ENDOMECHANICALS) ×2 IMPLANT
TROCAR XCEL NON-BLD 5MMX100MML (ENDOMECHANICALS) ×2 IMPLANT
WARMER LAPAROSCOPE (MISCELLANEOUS) ×2 IMPLANT

## 2022-04-22 NOTE — Transfer of Care (Signed)
Immediate Anesthesia Transfer of Care Note  Patient: Julia Stephens  Procedure(s) Performed: DIAGNOSTIC LAPAROSCOPY WITH REMOVAL OF ECTOPIC PREGNANCY AND LEFT SALPINGECTOMY (Abdomen)  Patient Location: PACU  Anesthesia Type:General  Level of Consciousness: awake  Airway & Oxygen Therapy: Patient Spontanous Breathing  Post-op Assessment: Report given to RN and Post -op Vital signs reviewed and stable  Post vital signs: Reviewed and stable  Last Vitals:  Vitals Value Taken Time  BP 130/71 04/22/22 1706  Temp 36.5 C 04/22/22 1705  Pulse 88 04/22/22 1708  Resp 22 04/22/22 1708  SpO2 97 % 04/22/22 1708  Vitals shown include unvalidated device data.  Last Pain:  Vitals:   04/22/22 1502  TempSrc: Oral  PainSc:          Complications: No notable events documented.

## 2022-04-22 NOTE — Discharge Summary (Signed)
Admission diagnosis: left ectopic pregnancy Discharge diagnosis: left ectopic pregnany Procedure: laparoscopy with left salpingectomy Condition on discharge: good Follow up in office 5-6 days

## 2022-04-22 NOTE — Anesthesia Preprocedure Evaluation (Addendum)
Anesthesia Evaluation  Patient identified by MRN, date of birth, ID band Patient awake    Reviewed: Allergy & Precautions, NPO status , Patient's Chart, lab work & pertinent test results  History of Anesthesia Complications Negative for: history of anesthetic complications  Airway Mallampati: II  TM Distance: >3 FB Neck ROM: Full    Dental  (+) Dental Advisory Given, Teeth Intact   Pulmonary neg pulmonary ROS,    Pulmonary exam normal        Cardiovascular negative cardio ROS Normal cardiovascular exam     Neuro/Psych PSYCHIATRIC DISORDERS Anxiety Depression negative neurological ROS     GI/Hepatic negative GI ROS, Neg liver ROS,   Endo/Other  negative endocrine ROS  Renal/GU negative Renal ROS     Musculoskeletal negative musculoskeletal ROS (+)   Abdominal   Peds  Hematology negative hematology ROS (+)   Anesthesia Other Findings   Reproductive/Obstetrics  Ectopic pregnancy                             Anesthesia Physical Anesthesia Plan  ASA: 2 and emergent  Anesthesia Plan: General   Post-op Pain Management: Tylenol PO (pre-op)*   Induction: Intravenous and Rapid sequence  PONV Risk Score and Plan: 3 and Treatment may vary due to age or medical condition, Ondansetron, Dexamethasone, Midazolam and Scopolamine patch - Pre-op  Airway Management Planned: Oral ETT  Additional Equipment: None  Intra-op Plan:   Post-operative Plan: Extubation in OR  Informed Consent: I have reviewed the patients History and Physical, chart, labs and discussed the procedure including the risks, benefits and alternatives for the proposed anesthesia with the patient or authorized representative who has indicated his/her understanding and acceptance.     Dental advisory given  Plan Discussed with: CRNA and Anesthesiologist  Anesthesia Plan Comments:        Anesthesia Quick  Evaluation

## 2022-04-22 NOTE — Progress Notes (Signed)
04/22/2022  5:05 PM  PATIENT:  Julia Stephens  31 y.o. female  PRE-OPERATIVE DIAGNOSIS:  Ectopic Pregnancy ruptured left  POST-OPERATIVE DIAGNOSIS:  ectopic pregnancy ruptured left  PROCEDURE:  Procedure(s): DIAGNOSTIC LAPAROSCOPY WITH REMOVAL OF ECTOPIC PREGNANCY AND LEFT SALPINGECTOMY (N/A)  SURGEON:  Surgeon(s) and Role:    * Everlene Farrier, MD - Primary  PHYSICIAN ASSISTANT:   ASSISTANTS: none   ANESTHESIA:   general  EBL:  350 mL   BLOOD ADMINISTERED:none  DRAINS: none   LOCAL MEDICATIONS USED:  MARCAINE    and Amount: 30 ml  SPECIMEN:  Source of Specimen:  left fallopian tube and ectopic pregnancy  DISPOSITION OF SPECIMEN:  PATHOLOGY  COUNTS:  YES  TOURNIQUET:  * No tourniquets in log *  DICTATION: .Other Dictation: Dictation Number [id number]  PLAN OF CARE: Discharge to home after PACU  PATIENT DISPOSITION:  PACU - hemodynamically stable.   Delay start of Pharmacological VTE agent (>24hrs) due to surgical blood loss or risk of bleeding: not applicable

## 2022-04-22 NOTE — Anesthesia Postprocedure Evaluation (Signed)
Anesthesia Post Note  Patient: Julia Stephens  Procedure(s) Performed: DIAGNOSTIC LAPAROSCOPY WITH REMOVAL OF ECTOPIC PREGNANCY AND LEFT SALPINGECTOMY (Abdomen)     Patient location during evaluation: PACU Anesthesia Type: General Level of consciousness: awake and alert Pain management: pain level controlled Vital Signs Assessment: post-procedure vital signs reviewed and stable Respiratory status: spontaneous breathing, nonlabored ventilation and respiratory function stable Cardiovascular status: stable and blood pressure returned to baseline Anesthetic complications: no   No notable events documented.  Last Vitals:  Vitals:   04/22/22 1720 04/22/22 1735  BP: 116/65 119/71  Pulse: 94 88  Resp: 18 16  Temp:  36.5 C  SpO2: 99% 94%    Last Pain:  Vitals:   04/22/22 1735  TempSrc:   PainSc: [name]

## 2022-04-22 NOTE — Discharge Instructions (Signed)
Go to the maternity assessment unit at [facility] for further evaluation and management as your pregnancy test was positive.

## 2022-04-22 NOTE — MAU Provider Note (Signed)
History     CSN: [id number]  Arrival date and time: 04/22/22 1147   Event Date/Time   First Provider Initiated Contact with Patient 04/22/22 1308      Chief Complaint  Patient presents with   Abdominal Pain   HPI Julia Stephens is a 31 y.o. G2P1001 at 42w2dwho presents with left sided lower abdominal pain. She reports the pain started last night and has gradually gotten worse. She tried ibuprofen last night with no relief. She reports she went to urgent care because of the pain and found out she was pregnant. She rates the pain a 8/10 and has not taken anything today for the pain. She reports she started seeing spotting since her arrival in MAU but no bleeding before today. She has not been seen anywhere this pregnancy but her last well woman exam at [facility] was in March.   OB History     Gravida  2   Para  1   Term  1   Preterm      AB      Living  1      SAB      IAB      Ectopic      Multiple  0   Live Births  1           Past Medical History:  Diagnosis Date   Dysmenorrhea    Vaginal Pap smear, abnormal     Past Surgical History:  Procedure Laterality Date   CESAREAN SECTION N/A 07/22/2018   Procedure: CESAREAN SECTION;  Surgeon: TEverlene Farrier MD;  Location: [facility]  Service: Obstetrics;  Laterality: N/A;   COLPOSCOPY  01/09/2012   Colpo LGSILw/HPV   WISDOM TOOTH EXTRACTION      Family History  Problem Relation Age of Onset   Polycystic kidney disease Maternal Grandmother    Diabetes Maternal Grandmother    Hypertension Maternal Grandmother    Vision loss Maternal Grandmother    Diabetes Maternal Grandfather    Hypertension Maternal Grandfather    Vision loss Maternal Grandfather    Hyperlipidemia Father    Hypertension Father    Hyperlipidemia Brother    Hypertension Brother    Diabetes Paternal Grandfather    Hypertension Paternal Grandfather    Hypertension Paternal Grandmother     Social History    Tobacco Use   Smoking status: Never   Smokeless tobacco: Never  Vaping Use   Vaping Use: Never used  Substance Use Topics   Alcohol use: Yes   Drug use: No    Allergies:  Allergies  Allergen Reactions   Other Itching    Allergic to cats Allergic to cats Allergic to cats    Medications Prior to Admission  Medication Sig Dispense Refill Last Dose   cetirizine (ZYRTEC) 10 MG tablet Take 10 mg by mouth daily.      desogestrel-ethinyl estradiol (APRI) 0.15-30 MG-MCG tablet Apri 0.15 mg-0.03 mg tablet  TAKE 1 TABLET BY MOUTH EVERY DAY      hydrOXYzine (VISTARIL) 25 MG capsule Take 1 capsule (25 mg total) by mouth every 8 (eight) hours as needed. 30 capsule 1     Review of Systems  Constitutional: Negative.  Negative for fatigue and fever.  HENT: Negative.    Respiratory: Negative.  Negative for shortness of breath.   Cardiovascular: Negative.  Negative for chest pain.  Gastrointestinal:  Positive for abdominal pain. Negative for constipation, diarrhea, nausea and vomiting.  Genitourinary:  Positive  for vaginal bleeding. Negative for dysuria and vaginal discharge.  Neurological: Negative.  Negative for dizziness and headaches.  Physical Exam   Blood pressure 131/84, pulse 89, temperature 98 F (36.7 C), temperature source Oral, resp. rate 18, height '5\' 8"'$  (1.727 m), weight 89.1 kg, last menstrual period 03/09/2022, SpO2 100 %.  Physical Exam Vitals and nursing note reviewed.  Constitutional:      General: She is not in acute distress.    Appearance: She is well-developed.  HENT:     Head: Normocephalic.  Eyes:     Pupils: Pupils are equal, round, and reactive to light.  Cardiovascular:     Rate and Rhythm: Normal rate and regular rhythm.     Heart sounds: Normal heart sounds.  Pulmonary:     Effort: Pulmonary effort is normal. No respiratory distress.     Breath sounds: Normal breath sounds.  Abdominal:     General: Bowel sounds are normal. There is no distension.      Palpations: Abdomen is soft.     Tenderness: There is abdominal tenderness in the suprapubic area and left lower quadrant.  Skin:    General: Skin is warm and dry.  Neurological:     Mental Status: She is alert and oriented to person, place, and time.  Psychiatric:        Mood and Affect: Mood normal.        Behavior: Behavior normal.        Thought Content: Thought content normal.        Judgment: Judgment normal.    MAU Course  Procedures Results for orders placed or performed during the hospital encounter of 04/22/22 (from the past 24 hour(s))  CBC     Status: None   Collection Time: 04/22/22 12:08 PM  Result Value Ref Range   WBC 9.5 4.0 - 10.5 K/uL   RBC 4.23 3.87 - 5.11 MIL/uL   Hemoglobin 13.3 12.0 - 15.0 g/dL   HCT 38.7 36.0 - 46.0 %   MCV 91.5 80.0 - 100.0 fL   MCH 31.4 26.0 - 34.0 pg   MCHC 34.4 30.0 - 36.0 g/dL   RDW 11.9 11.5 - 15.5 %   Platelets 197 150 - 400 K/uL   nRBC 0.0 0.0 - 0.2 %  hCG, quantitative, pregnancy     Status: Abnormal   Collection Time: 04/22/22 12:08 PM  Result Value Ref Range   hCG, Beta Chain, Quant, S 459 (H) <5 mIU/mL  Wet prep, genital     Status: Abnormal   Collection Time: 04/22/22 12:46 PM   Specimen: PATH Cytology Cervicovaginal Ancillary Only  Result Value Ref Range   Yeast Wet Prep HPF POC NONE SEEN NONE SEEN   Trich, Wet Prep NONE SEEN NONE SEEN   Clue Cells Wet Prep HPF POC PRESENT (A) NONE SEEN   WBC, Wet Prep HPF POC <10 <10   Sperm NONE SEEN   Urinalysis, Routine w reflex microscopic PATH Cytology Cervicovaginal Ancillary Only     Status: Abnormal   Collection Time: 04/22/22 12:46 PM  Result Value Ref Range   Color, Urine YELLOW YELLOW   APPearance HAZY (A) CLEAR   Specific Gravity, Urine 1.014 1.005 - 1.030   pH 6.0 5.0 - 8.0   Glucose, UA NEGATIVE NEGATIVE mg/dL   Hgb urine dipstick LARGE (A) NEGATIVE   Bilirubin Urine NEGATIVE NEGATIVE   Ketones, ur 5 (A) NEGATIVE mg/dL   Protein, ur NEGATIVE NEGATIVE mg/dL    Nitrite NEGATIVE  NEGATIVE   Leukocytes,Ua NEGATIVE NEGATIVE   RBC / HPF >50 (H) 0 - 5 RBC/hpf   WBC, UA 6-10 0 - 5 WBC/hpf   Bacteria, UA RARE (A) NONE SEEN   Squamous Epithelial / LPF 0-5 0 - 5   Mucus PRESENT     US OB LESS THAN 14 WEEKS WITH OB TRANSVAGINAL  Result Date: 04/22/2022 CLINICAL DATA:  Abdominal pain in 1st trimester pregnancy. EXAM: OBSTETRIC <14 WK [place] AND TRANSVAGINAL OB US TECHNIQUE: Both transabdominal and transvaginal ultrasound examinations were performed for complete evaluation of the gestation as well as the maternal uterus, adnexal regions, and pelvic cul-de-sac. Transvaginal technique was performed to assess early pregnancy. COMPARISON:  None Available. FINDINGS: Intrauterine gestational sac: None Maternal uterus/adnexae: A large complex mass is seen in the left adnexa which measures 6.7 x 6.2 x 5.9 cm. Moderate amount of complex free fluid also seen, consistent with hemoperitoneum. IMPRESSION: 6.7 cm left adnexal mass and moderate amount of complex free fluid, consistent with ruptured ectopic pregnancy Critical Value/emergent results were called by telephone at the time of interpretation on 04/22/2022 at 1:58 pm to provider Julia Stephens , who verbally acknowledged these results. Electronically Signed   By: Marlaine Hind M.D.   On: 04/22/2022 14:00     MDM UA, UPT CBC, HCG ABO/Rh- AB Neg Wet prep and gc/chlamydia US OB Comp Less 14 weeks with Transvaginal  Dr. Gaetano Net notified of results and presentation- will come see patient in MAU and RN will prepare for surgery  IV placement Type and Screen Rhogam work up ordered give AB Neg status  Assessment and Plan   1. Ruptured ectopic pregnancy   2. Left tubal pregnancy without intrauterine pregnancy    -Prepare for OR -Care turned over to MD  [place] 04/22/2022, 1:08 PM

## 2022-04-22 NOTE — ED Provider Notes (Signed)
[facility]    CSN: [id number] Arrival date & time: 04/22/22  0903      History   Chief Complaint Chief Complaint  Patient presents with   Abdominal Pain    HPI Julia Stephens is a 31 y.o. female.   Patient presents with bilateral lower abdominal pain that started yesterday.  Patient reports that the pain is cramping and burning in nature.  It is a constant pain and rated 6/10 on pain scale.  Having bowel movements helps alleviate pain temporarily.  Denies nausea, vomiting, diarrhea, constipation.  Having normal bowel movements.  Denies blood in stool.  Denies dysuria, urinary frequency, abnormal vaginal bleeding, vaginal discharge, hematuria, back pain, fever.  Patient reports that there is a chance of pregnancy as she has been actively trying to conceive.   Abdominal Pain  Past Medical History:  Diagnosis Date   Dysmenorrhea    Vaginal Pap smear, abnormal     Patient Active Problem List   Diagnosis Date Noted   Anxiety and depression 05/24/2021   Abnormal Pap smear of cervix 02/17/2021   Post-dates pregnancy 07/22/2018   Routine health maintenance 06/09/2014   Low TSH level 02/20/2013   History of shingles 01/08/2013   Low grade squamous intraepithelial dysplasia 12/18/2011   Family history of polycystic kidney disease 11/14/2011   Hematuria 11/14/2011   Dysmenorrhea     Past Surgical History:  Procedure Laterality Date   CESAREAN SECTION N/A 07/22/2018   Procedure: CESAREAN SECTION;  Surgeon: Everlene Farrier, MD;  Location: [facility];  Service: Obstetrics;  Laterality: N/A;   COLPOSCOPY  01/09/2012   Colpo LGSILw/HPV   WISDOM TOOTH EXTRACTION      OB History     Gravida  1   Para  1   Term  1   Preterm      AB      Living  1      SAB      IAB      Ectopic      Multiple  0   Live Births  1            Home Medications    Prior to Admission medications   Medication Sig Start Date End Date Taking? Authorizing  Provider  desogestrel-ethinyl estradiol (APRI) 0.15-30 MG-MCG tablet Apri 0.15 mg-0.03 mg tablet  TAKE 1 TABLET BY MOUTH EVERY DAY    [provider]  hydrOXYzine (VISTARIL) 25 MG capsule Take 1 capsule (25 mg total) by mouth every 8 (eight) hours as needed. 05/24/21   Camillia Herter, NP    Family History Family History  Problem Relation Age of Onset   Polycystic kidney disease Maternal Grandmother    Diabetes Maternal Grandmother    Hypertension Maternal Grandmother    Vision loss Maternal Grandmother    Diabetes Maternal Grandfather    Hypertension Maternal Grandfather    Vision loss Maternal Grandfather    Hyperlipidemia Father    Hypertension Father    Hyperlipidemia Brother    Hypertension Brother    Diabetes Paternal Grandfather    Hypertension Paternal Grandfather    Hypertension Paternal Grandmother     Social History Social History   Tobacco Use   Smoking status: Never   Smokeless tobacco: Never  Vaping Use   Vaping Use: Never used  Substance Use Topics   Alcohol use: Yes   Drug use: No     Allergies   Other   Review of Systems Review of Systems  Per HPI  Physical Exam Triage Vital Signs ED Triage Vitals [04/22/22 1042]  Enc Vitals Group     BP (!) 139/105     Pulse Rate 79     Resp 18     Temp 98 F (36.7 C)     Temp Source Oral     SpO2 98 %     Weight      Height      Head Circumference      Peak Flow      Pain Score 0     Pain Loc      Pain Edu?      Excl. in [place]?    No data found.  Updated Vital Signs BP (!) 139/105 (BP Location: Left Arm)   Pulse 79   Temp 98 F (36.7 C) (Oral)   Resp 18   SpO2 98%   Breastfeeding No   Visual Acuity Right Eye Distance:   Left Eye Distance:   Bilateral Distance:    Right Eye Near:   Left Eye Near:    Bilateral Near:     Physical Exam Constitutional:      General: She is not in acute distress.    Appearance: Normal appearance. She is not toxic-appearing or diaphoretic.   HENT:     Head: Normocephalic and atraumatic.  Eyes:     Extraocular Movements: Extraocular movements intact.     Conjunctiva/sclera: Conjunctivae normal.  Cardiovascular:     Rate and Rhythm: Normal rate and regular rhythm.     Pulses: Normal pulses.     Heart sounds: Normal heart sounds.  Pulmonary:     Effort: Pulmonary effort is normal. No respiratory distress.     Breath sounds: Normal breath sounds.  Abdominal:     General: Abdomen is flat. Bowel sounds are normal. There is no distension.     Palpations: Abdomen is soft.     Tenderness: There is abdominal tenderness in the right lower quadrant, suprapubic area and left lower quadrant.  Neurological:     General: No focal deficit present.     Mental Status: She is alert and oriented to person, place, and time. Mental status is at baseline.  Psychiatric:        Mood and Affect: Mood normal.        Behavior: Behavior normal.        Thought Content: Thought content normal.        Judgment: Judgment normal.     UC Treatments / Results  Labs (all labs ordered are listed, but only abnormal results are displayed) Labs Reviewed  POCT URINALYSIS DIP (MANUAL ENTRY) - Abnormal; Notable for the following components:      Result Value   Blood, UA trace-lysed (*)    All other components within normal limits  POCT URINE PREGNANCY - Abnormal; Notable for the following components:   Preg Test, Ur Positive (*)    All other components within normal limits    EKG   Radiology No results found.  Procedures Procedures (including critical care time)  Medications Ordered in UC Medications - No data to display  Initial Impression / Assessment and Plan / UC Course  I have reviewed the triage vital signs and the nursing notes.  Pertinent labs & imaging results that were available during my care of the patient were reviewed by me and considered in my medical decision making (see chart for details).     Urine pregnancy test was  positive.  Urinalysis not showing signs of urinary tract infection.  Given significant abdominal pain in the setting of pregnancy, patient was advised to go to the maternity assessment unit at [facility] for further evaluation and management.  Patient was agreeable with plan.  Vital signs stable at discharge.  Agree with patient self transport to the hospital. Final Clinical Impressions(s) / UC Diagnoses   Final diagnoses:  Abdominal pain during pregnancy, antepartum     Discharge Instructions      Go to the maternity assessment unit at [facility] for further evaluation and management as your pregnancy test was positive.    ED Prescriptions   None    PDMP not reviewed this encounter.   Teodora Medici, [place] 04/22/22 1129

## 2022-04-22 NOTE — Discharge Instructions (Signed)
Call office for an appointment for this Thursday or Friday No heavy lifting

## 2022-04-22 NOTE — MAU Note (Signed)
Julia Stephens is a 31 y.o. at Unknown here in MAU reporting: constant lower abdominal pain that began yesterday morning, reports pain is mostly on left side .  Denies VB. LMP: 03/09/2022 Onset of complaint: yesterday Pain score: 8/10 Vitals:   04/22/22 1241  BP: 131/84  Pulse: 89  Resp: 18  Temp: 98 F (36.7 C)  SpO2: 100%     FHT:N/A Lab orders placed from triage:   UA

## 2022-04-22 NOTE — ED Triage Notes (Signed)
Pt c/o lower abd/pelvic cramping onset ~ yesterday. States it started yesterday. Sensation has been moving upward.   Denies nausea, vomiting, diarrhea, constipation   Tried motrin without relief. States not recently coming off or starting menses.

## 2022-04-22 NOTE — Op Note (Signed)
NAME: Julia Stephens, Julia Stephens MEDICAL RECORD NO: [id number] ACCOUNT NO: [id number] DATE OF BIRTH: 02/14/1991 FACILITY: [facility] LOCATION: [facility] PHYSICIAN: Daleen Bo. Lyn Hollingshead, MD  Operative Report   DATE OF PROCEDURE: 04/22/2022  PREOPERATIVE DIAGNOSIS:  Left ectopic pregnancy.  POSTOPERATIVE DIAGNOSIS:  Left ectopic pregnancy.  PROCEDURE:  Laparoscopy with left salpingectomy.  SURGEON:  Everlene Farrier II, MD  ANESTHESIA:  General with endotracheal intubation.  ESTIMATED BLOOD LOSS:  350 mL (330 mL of that is clot and blood in the pelvis).  SPECIMENS:  Left tube with ectopic pregnancy to pathology.  INDICATIONS AND CONSENT:  This patient is a 31 year old G2, P1, who is 6 and 2/7th weeks by dates.  She developed pain yesterday, which intensified today primarily in the left lower quadrant.  Ultrasound is consistent with an empty uterus, a 6 to 7 cm  collection of fluid in the left adnexa, a quantitative hCG of 459.  Diagnosis of probable bleeding left ectopic pregnancy is discussed.  Recommendation for laparoscopy, possible laparotomy, possible left salpingo-oophorectomy was discussed.  Potential  risks and complications are discussed preoperatively including but not limited to infection, organ damage, bleeding requiring transfusion of blood products with HIV and hepatitis acquisition, DVT, PE, pneumonia.  Also, possible recurrent ectopic  pregnancy in the future and the need for following quantitative hCGs to 0 after the surgery and possible need for methotrexate has been reviewed.  The patient states she understands and agrees.  FINDINGS:  There is approximately 300 to 350 mL of blood and clot in the pelvis and primarily the left adnexa.  The left fallopian tube was greatly distended by an ectopic pregnancy in the distal one-half.  It is bleeding via the fimbriated end.  Both  ovaries and the right fallopian tube are normal.  Upper abdomen is grossly normal.  DESCRIPTION OF PROCEDURE:  The  patient was taken to the operating room where she is identified, placed in the dorsal supine position and general anesthesia was induced via endotracheal intubation.  She was placed in the dorsal lithotomy position.   Timeout was undertaken.  She was prepped vaginally with Hibiclens.  A Foley catheter was placed and a Hulka tenaculum was placed in the uterus as a manipulator.  She was prepped abdominally with ChloraPrep.  After 3-minute drying time, she was draped in  a sterile fashion.  A small infraumbilical incision was made.  Towel clips were used at the 3 and 9 o'clock positions around the umbilicus to elevate the anterior abdominal wall and a disposable Veress needle was placed on the first attempt without  difficulty.  A good syringe and drop test are noted.  Two liters of gas were insufflated under low pressure with good tympany in the right upper quadrant.  Veress needle was removed and a 10/11 Xcel bladeless disposable trocar sleeve was placed using  direct visualization with the diagnostic scope.  After placement, the operative scope was used.  The above findings were noted.  A small suprapubic incision was made in the midline and a 5 mm disposable trocar sleeve was placed under direct visualization  without difficulty.  As much of the blood and clot are aspirated as possible prior to Trendelenburg.  She was then placed in Trendelenburg position for better visualization.  The EnSeal bipolar cautery cutting instrument was then used to take down the  left fallopian tube and amputate it proximally.  This was then placed in the cul-de-sac.  Then, using the 5 mm 0-degree scope suprapubically, an EndoCatch bag was  used to retrieve the specimen without difficulty.  A second bag was used to remove much of  the clot that was in the posterior cul-de-sac.  Then, changing back to the operative scope through the umbilical trocar sleeve, further copious irrigation is done.  Also, some brief bipolar cautery was  used along the margin of the salpingectomy to assure  complete hemostasis.  Multiple inspections with reduced pneumoperitoneum reveals continued excellent hemostasis.  All of the clot has been removed.  Excess fluid was removed.  The remaining approximately 20 mL of 0.5% plain Marcaine was placed in the  pelvis.  Suprapubic trocar sleeve was removed.  Umbilical trocar sleeve was removed after the pneumoperitoneum has been reduced.  The umbilical incision was closed in the deeper subcutaneous layers with great care being taken not to pick up any  underlying structures with 0 Vicryl.  This gives good closure.  The skin on both incisions was closed with 2-0 Vicryl.  Dermabond was placed on all of the incisions.  The Hulka tenaculum was removed and no bleeding is noted.  All counts were correct.   The patient was taken to the recovery room in stable condition.  Also, the patient is Rh negative and will receive RhoGAM.   SHW D: 04/22/2022 5:13:49 pm T: 04/22/2022 10:10:00 pm  JOB: [id number]/ [id number]

## 2022-04-22 NOTE — Anesthesia Procedure Notes (Signed)
Procedure Name: Intubation Date/Time: 04/22/2022 3:31 PM Performed by: Lorie Phenix, CRNA Pre-anesthesia Checklist: Patient identified, Emergency Drugs available, Suction available and Patient being monitored Patient Re-evaluated:Patient Re-evaluated prior to induction Oxygen Delivery Method: Circle system utilized Preoxygenation: Pre-oxygenation with 100% oxygen Induction Type: IV induction Ventilation: Mask ventilation without difficulty Laryngoscope Size: Mac and 3 Grade View: Grade I Tube type: Oral Tube size: 7.0 mm Number of attempts: 1 Airway Equipment and Method: Stylet Placement Confirmation: ETT inserted through vocal cords under direct vision, positive ETCO2 and breath sounds checked- equal and bilateral Secured at: 22 cm Tube secured with: Tape Dental Injury: Teeth and Oropharynx as per pre-operative assessment

## 2022-04-22 NOTE — H&P (Signed)
CSN: [id number]   Arrival date and time: 04/22/22 1147    Event Date/Time   First Provider Initiated Contact with Patient 04/22/22 1308          Chief Complaint  Patient presents with   Abdominal Pain    HPI Julia Stephens is a 31 y.o. G2P1001 at 43w2dwho presents with left sided lower abdominal pain. She reports the pain started last night and has gradually gotten worse. She tried ibuprofen last night with no relief. She reports she went to urgent care because of the pain and found out she was pregnant. She rates the pain a 8/10 and has not taken anything today for the pain. She reports she started seeing spotting since her arrival in [facility] but no bleeding before today. She has not been seen anywhere this pregnancy but her last well woman exam at [facility] was in March.    OB History       Gravida  2   Para  1   Term  1   Preterm      AB      Living  1        SAB      IAB      Ectopic      Multiple  0   Live Births  1                   Past Medical History:  Diagnosis Date   Dysmenorrhea     Vaginal Pap smear, abnormal             Past Surgical History:  Procedure Laterality Date   CESAREAN SECTION N/A 07/22/2018    Procedure: CESAREAN SECTION;  Surgeon: TEverlene Farrier MD;  Location: [facility]  Service: Obstetrics;  Laterality: N/A;   COLPOSCOPY   01/09/2012    Colpo LGSILw/HPV   WISDOM TOOTH EXTRACTION               Family History  Problem Relation Age of Onset   Polycystic kidney disease Maternal Grandmother     Diabetes Maternal Grandmother     Hypertension Maternal Grandmother     Vision loss Maternal Grandmother     Diabetes Maternal Grandfather     Hypertension Maternal Grandfather     Vision loss Maternal Grandfather     Hyperlipidemia Father     Hypertension Father     Hyperlipidemia Brother     Hypertension Brother     Diabetes Paternal Grandfather     Hypertension Paternal Grandfather     Hypertension  Paternal Grandmother        Social History        Tobacco Use   Smoking status: Never   Smokeless tobacco: Never  Vaping Use   Vaping Use: Never used  Substance Use Topics   Alcohol use: Yes   Drug use: No      Allergies:       Allergies  Allergen Reactions   Other Itching      Allergic to cats Allergic to cats Allergic to cats             Medications Prior to Admission  Medication Sig Dispense Refill Last Dose   cetirizine (ZYRTEC) 10 MG tablet Take 10 mg by mouth daily.         desogestrel-ethinyl estradiol (APRI) 0.15-30 MG-MCG tablet Apri 0.15 mg-0.03 mg tablet  TAKE 1 TABLET BY MOUTH EVERY DAY  hydrOXYzine (VISTARIL) 25 MG capsule Take 1 capsule (25 mg total) by mouth every 8 (eight) hours as needed. 30 capsule 1        Review of Systems  Constitutional: Negative.  Negative for fatigue and fever.  HENT: Negative.    Respiratory: Negative.  Negative for shortness of breath.   Cardiovascular: Negative.  Negative for chest pain.  Gastrointestinal:  Positive for abdominal pain. Negative for constipation, diarrhea, nausea and vomiting.  Genitourinary:  Positive for vaginal bleeding. Negative for dysuria and vaginal discharge.  Neurological: Negative.  Negative for dizziness and headaches.  Physical Exam    Blood pressure 131/84, pulse 89, temperature 98 F (36.7 C), temperature source Oral, resp. rate 18, height '5\' 8"'$  (1.727 m), weight 89.1 kg, last menstrual period 03/09/2022, SpO2 100 %.   Physical Exam Vitals and nursing note reviewed.  Constitutional:      General: She is not in acute distress.    Appearance: She is well-developed.  HENT:     Head: Normocephalic.  Eyes:     Pupils: Pupils are equal, round, and reactive to light.  Cardiovascular:     Rate and Rhythm: Normal rate and regular rhythm.     Heart sounds: Normal heart sounds.  Pulmonary:     Effort: Pulmonary effort is normal. No respiratory distress.     Breath sounds: Normal breath  sounds.  Abdominal:     General: Bowel sounds are normal. There is no distension.     Palpations: Abdomen is soft.     Tenderness: There is abdominal tenderness in the suprapubic area and left lower quadrant.  Skin:    General: Skin is warm and dry.  Neurological:     Mental Status: She is alert and oriented to person, place, and time.  Psychiatric:        Mood and Affect: Mood normal.        Behavior: Behavior normal.        Thought Content: Thought content normal.        Judgment: Judgment normal.      MAU Course  Procedures Lab Results Last 24 Hours       Results for orders placed or performed during the hospital encounter of 04/22/22 (from the past 24 hour(s))  CBC     Status: None    Collection Time: 04/22/22 12:08 PM  Result Value Ref Range    WBC 9.5 4.0 - 10.5 K/uL    RBC 4.23 3.87 - 5.11 MIL/uL    Hemoglobin 13.3 12.0 - 15.0 g/dL    HCT 38.7 36.0 - 46.0 %    MCV 91.5 80.0 - 100.0 fL    MCH 31.4 26.0 - 34.0 pg    MCHC 34.4 30.0 - 36.0 g/dL    RDW 11.9 11.5 - 15.5 %    Platelets 197 150 - 400 K/uL    nRBC 0.0 0.0 - 0.2 %  hCG, quantitative, pregnancy     Status: Abnormal    Collection Time: 04/22/22 12:08 PM  Result Value Ref Range    hCG, Beta Chain, Quant, S 459 (H) <5 mIU/mL  Wet prep, genital     Status: Abnormal    Collection Time: 04/22/22 12:46 PM    Specimen: PATH Cytology Cervicovaginal Ancillary Only  Result Value Ref Range    Yeast Wet Prep HPF POC NONE SEEN NONE SEEN    Trich, Wet Prep NONE SEEN NONE SEEN    Clue Cells Wet Prep HPF POC PRESENT (A)  NONE SEEN    WBC, Wet Prep HPF POC <10 <10    Sperm NONE SEEN    Urinalysis, Routine w reflex microscopic PATH Cytology Cervicovaginal Ancillary Only     Status: Abnormal    Collection Time: 04/22/22 12:46 PM  Result Value Ref Range    Color, Urine YELLOW YELLOW    APPearance HAZY (A) CLEAR    Specific Gravity, Urine 1.014 1.005 - 1.030    pH 6.0 5.0 - 8.0    Glucose, UA NEGATIVE NEGATIVE mg/dL     Hgb urine dipstick LARGE (A) NEGATIVE    Bilirubin Urine NEGATIVE NEGATIVE    Ketones, ur 5 (A) NEGATIVE mg/dL    Protein, ur NEGATIVE NEGATIVE mg/dL    Nitrite NEGATIVE NEGATIVE    Leukocytes,Ua NEGATIVE NEGATIVE    RBC / HPF >50 (H) 0 - 5 RBC/hpf    WBC, UA 6-10 0 - 5 WBC/hpf    Bacteria, UA RARE (A) NONE SEEN    Squamous Epithelial / LPF 0-5 0 - 5    Mucus PRESENT        US OB LESS THAN 14 WEEKS WITH OB TRANSVAGINAL   Result Date: 04/22/2022 CLINICAL DATA:  Abdominal pain in 1st trimester pregnancy. EXAM: OBSTETRIC <14 WK [place] AND TRANSVAGINAL OB US TECHNIQUE: Both transabdominal and transvaginal ultrasound examinations were performed for complete evaluation of the gestation as well as the maternal uterus, adnexal regions, and pelvic cul-de-sac. Transvaginal technique was performed to assess early pregnancy. COMPARISON:  None Available. FINDINGS: Intrauterine gestational sac: None Maternal uterus/adnexae: A large complex mass is seen in the left adnexa which measures 6.7 x 6.2 x 5.9 cm. Moderate amount of complex free fluid also seen, consistent with hemoperitoneum. IMPRESSION: 6.7 cm left adnexal mass and moderate amount of complex free fluid, consistent with ruptured ectopic pregnancy Critical Value/emergent results were called by telephone at the time of interpretation on 04/22/2022 at 1:58 pm to provider CAROLINE NEILL , who verbally acknowledged these results. Electronically Signed   By: Marlaine Hind M.D.   On: 04/22/2022 14:00      MDM UA, UPT CBC, HCG ABO/Rh- AB Neg Wet prep and gc/chlamydia US OB Comp Less 14 weeks with Transvaginal   Dr. Gaetano Net notified of results and presentation- will come see patient in [facility]U and RN will prepare for surgery   IV placement Type and Screen Rhogam work up ordered give AB Neg status   Assessment and Plan    1. Ruptured ectopic pregnancy   2. Left tubal pregnancy without intrauterine pregnancy     -Prepare for OR -Care turned over to MD    [place] 04/22/2022, 1:08 PM   Agree with above. 31 yo G2P1 with last normal menses 6 2/7 weeks ago. Developed LLQ pain yesterday that became worse today. No N/V, no fever, no dizziness. Last food about 6pm last night.  Today's Vitals   04/22/22 1236 04/22/22 1238 04/22/22 1241  BP:   131/84  Pulse:   89  Resp:   18  Temp:   98 F (36.7 C)  TempSrc:   Oral  SpO2:   100%  Weight: 89.1 kg    Height: '5\' 8"'$  (1.727 m)    PainSc:  8     Body mass index is 29.88 kg/m.  Lungs CTA Cor RRR Abdomen soft with LLQ tenderness without mass or rebound  A/P: D/W patient that this is not a normal pregnancy. There is no gestational sac in uterus and  a 6-7 cm mass, probably clot, in her left adnexa C/W a bleeding or ruptured ectopic pregnancy. QHCG = 459. Recommend L/S, poss laparotomy, possible left salpingo oophorectomy. D/W risks including infection, organ damage, bleeding/transfusion-HIV/Hep, DVT/PE, pneumonia. D/W need for FU [facility] and possible need for PO methotrexate. D/W increased risk of recurrent ectopic in the future. All questions answered. She states she understands and agrees.

## 2022-04-23 ENCOUNTER — Encounter (HOSPITAL_COMMUNITY): Payer: Self-pay | Admitting: Obstetrics and Gynecology

## 2022-04-23 LAB — RH IG WORKUP (INCLUDES ABO/RH)
Gestational Age(Wks): 6
Unit division: 0

## 2022-04-23 LAB — RPR: RPR Ser Ql: NONREACTIVE

## 2022-04-24 LAB — GC/CHLAMYDIA PROBE AMP (~~LOC~~) NOT AT ARMC
Chlamydia: NEGATIVE
Comment: NEGATIVE
Comment: NORMAL
Neisseria Gonorrhea: NEGATIVE

## 2022-04-26 LAB — SURGICAL PATHOLOGY

## 2022-11-28 IMAGING — US US OB < 14 WEEKS - US OB TV
1 series · 15 of 28 positions shown · non-contrast
Comparison: None Available.

CLINICAL DATA: Abdominal pain in 1st trimester pregnancy.

EXAM:
OBSTETRIC <14 WK US AND TRANSVAGINAL OB US
TECHNIQUE: Both transabdominal and transvaginal ultrasound examinations were
performed for complete evaluation of the gestation as well as the
maternal uterus, adnexal regions, and pelvic cul-de-sac.
Transvaginal technique was performed to assess early pregnancy.

[Series 1: us ob < 14 weeks - us ob tv · 15 of 92 slices shown]
[im 1/92]
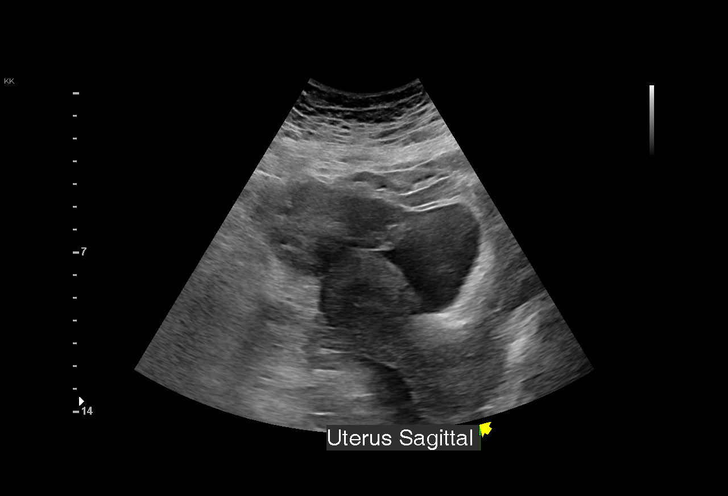
[im 7/92]
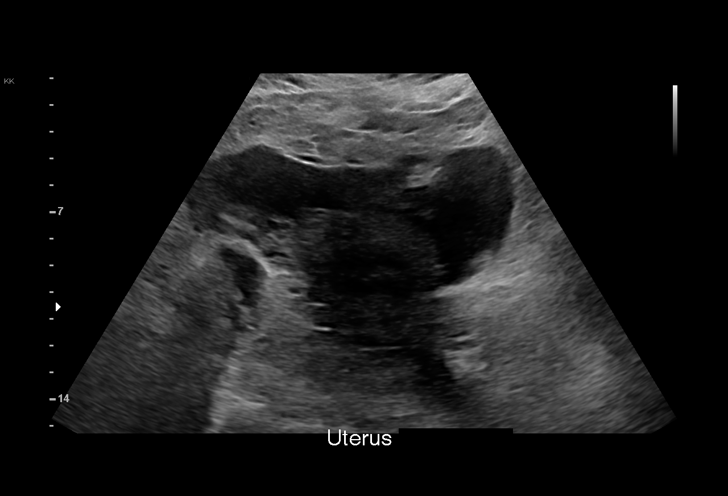
[im 14/92]
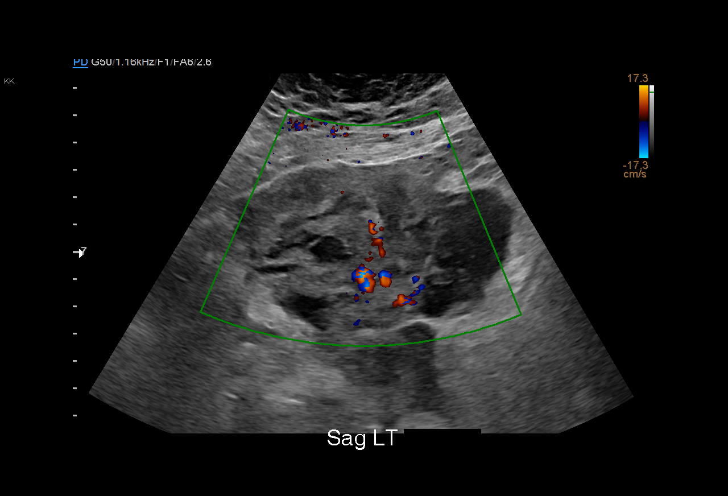
[im 21/92]
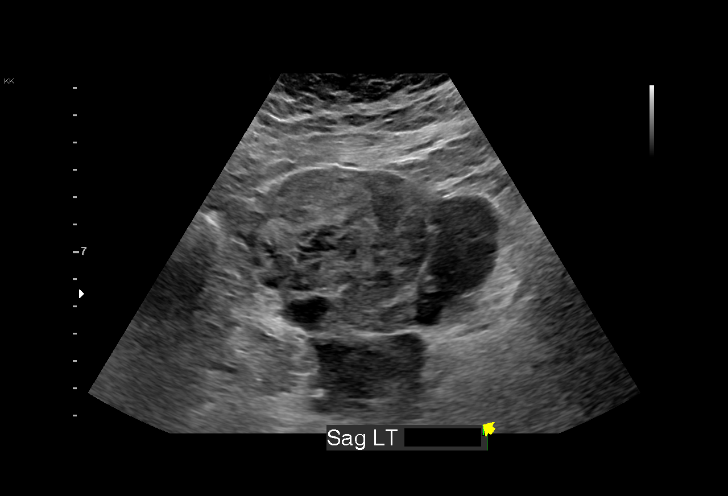
[im 27/92]
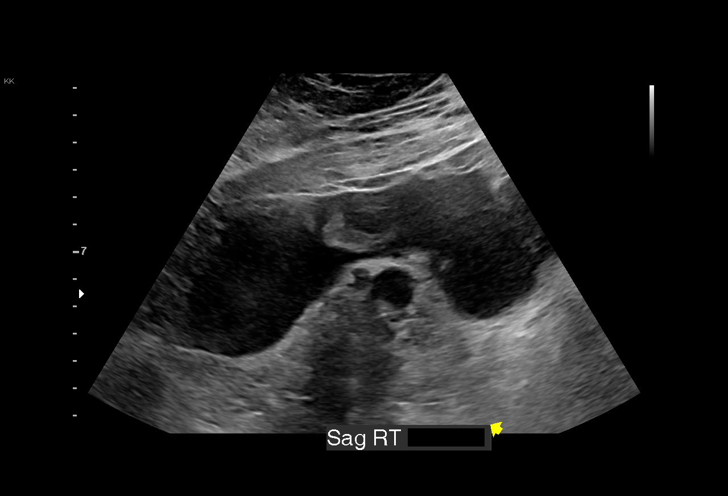
[im 34/92]
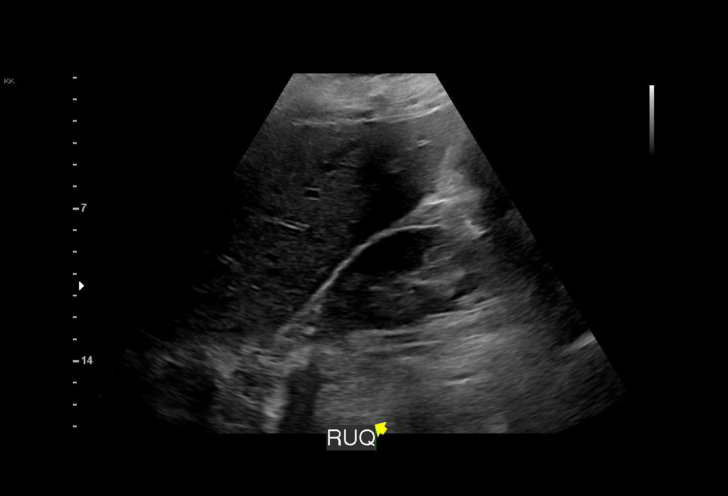
[im 41/92]
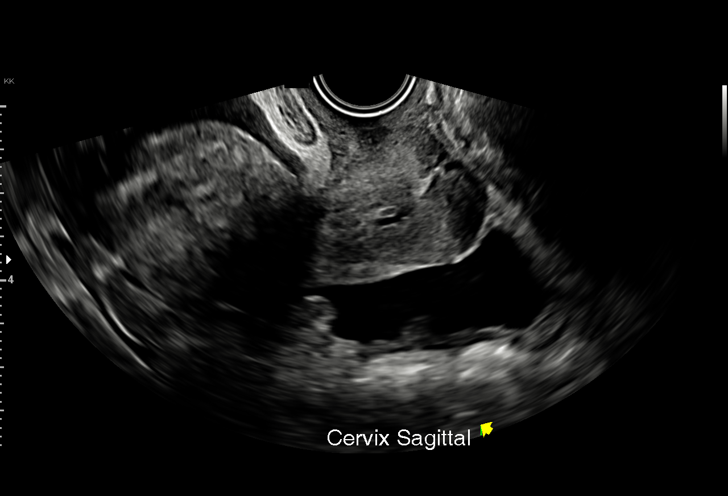
[im 48/92]
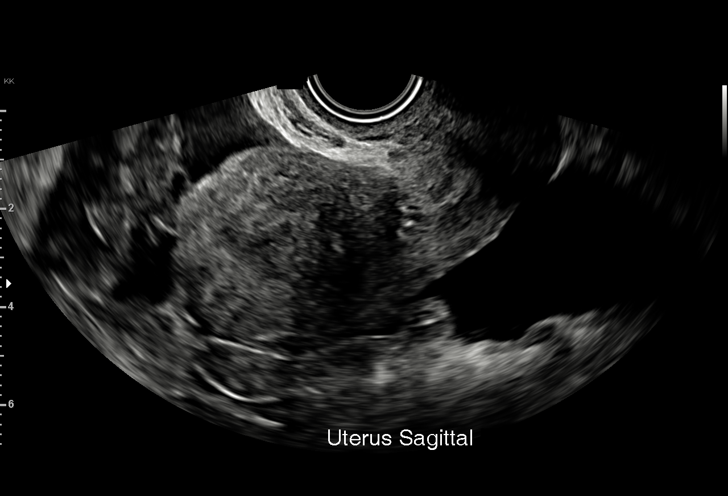
[im 51/92]
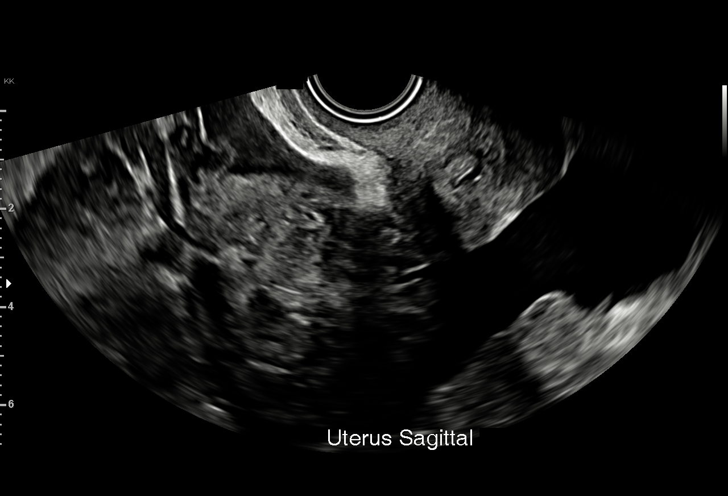
[im 58/92]
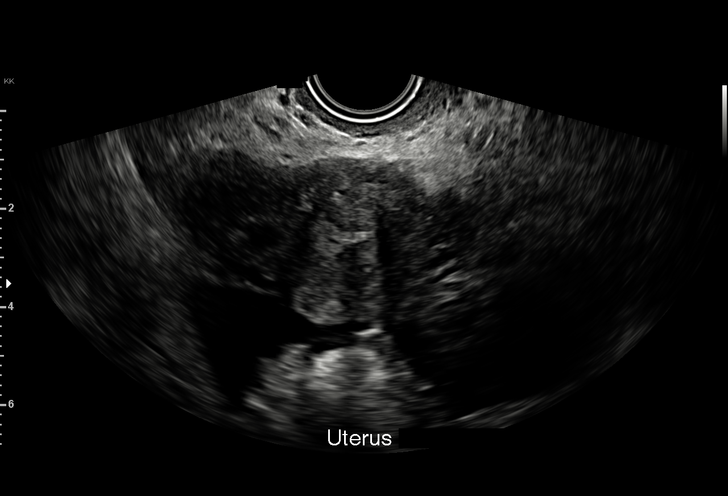
[im 65/92]
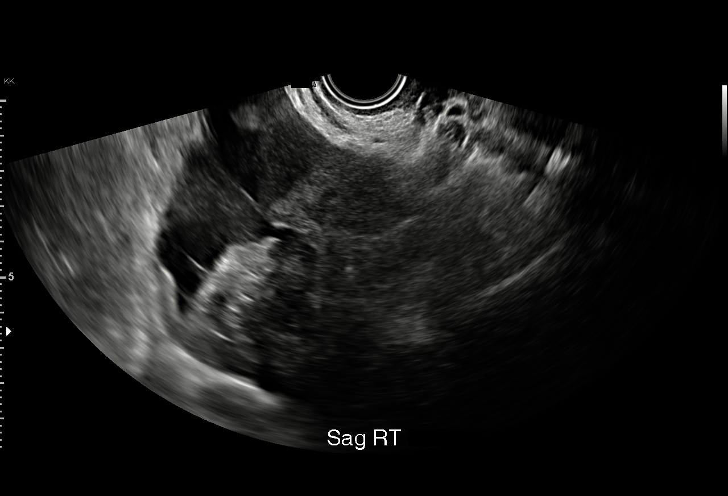
[im 71/92]
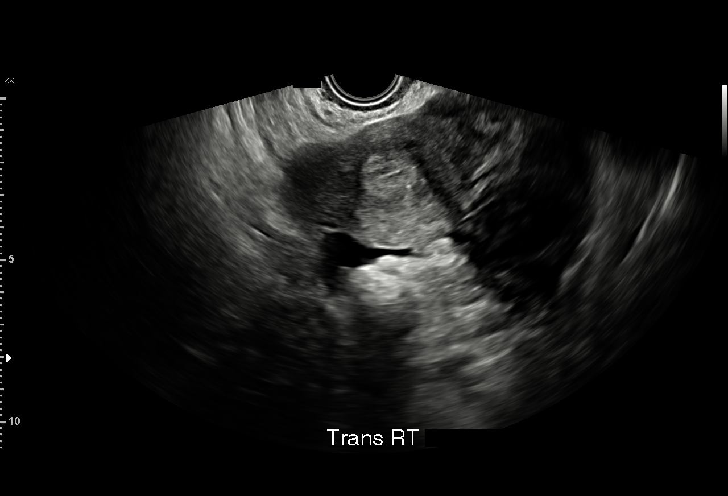
[im 78/92]
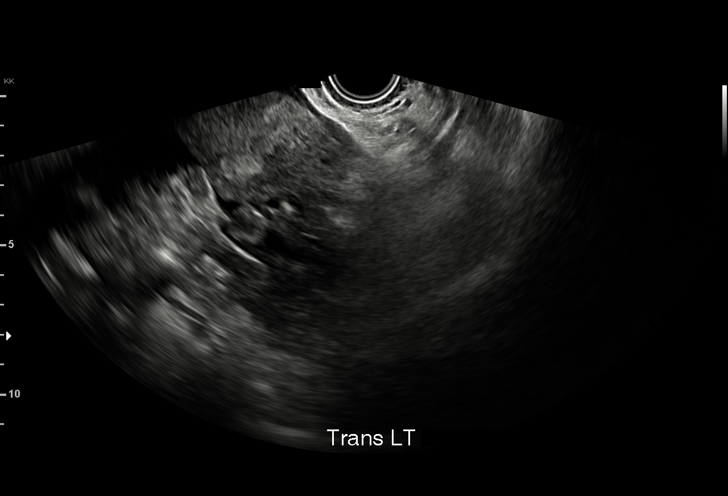
[im 85/92]
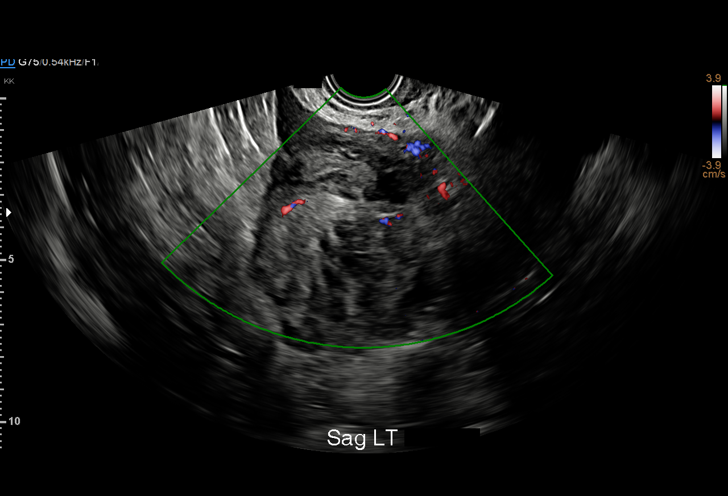
[im 92/92]
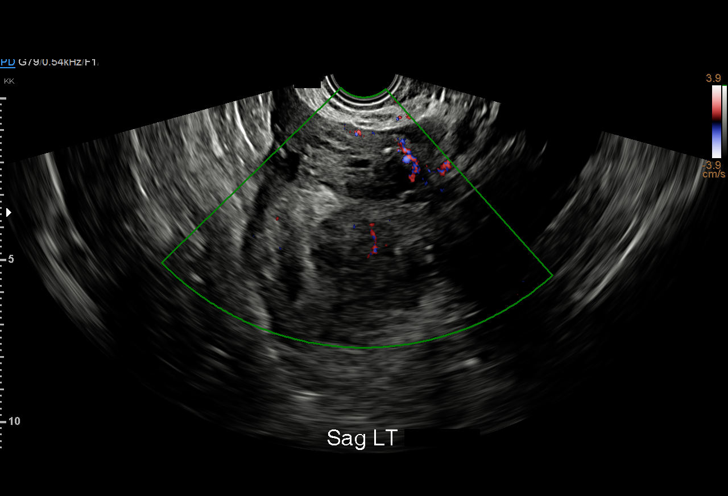

[15 of 28 positions shown; findings below may reference images not displayed]

FINDINGS: Intrauterine gestational sac: None

Maternal uterus/adnexae: A large complex mass is seen in the left
adnexa which measures 6.7 x 6.2 x 5.9 cm. Moderate amount of complex
free fluid also seen, consistent with hemoperitoneum.
IMPRESSION: 6.7 cm left adnexal mass and moderate amount of complex free fluid,
consistent with ruptured ectopic pregnancy

Critical Value/emergent results were called by telephone at the time
of interpretation on 04/22/2022 at [DATE] to provider SEA MAKKA
, who verbally acknowledged these results.
